# Patient Record
Sex: Female | Born: 1985 | Race: Black or African American | Hispanic: No | Marital: Single | Smoking: Former smoker
Health system: Southern US, Community
[De-identification: ages and names within clinical notes are randomized; demographics above are authoritative.]

## PROBLEM LIST (undated history)

## (undated) DIAGNOSIS — J45909 Unspecified asthma, uncomplicated: Secondary | ICD-10-CM

---
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
## 2006-01-23 ENCOUNTER — Emergency Department (HOSPITAL_COMMUNITY): Admission: EM | Admit: 2006-01-23 | Discharge: 2006-01-23 | Payer: Self-pay | Admitting: Emergency Medicine

## 2006-09-10 ENCOUNTER — Emergency Department (HOSPITAL_COMMUNITY): Admission: EM | Admit: 2006-09-10 | Discharge: 2006-09-10 | Payer: Self-pay | Admitting: Emergency Medicine

## 2006-10-27 ENCOUNTER — Emergency Department (HOSPITAL_COMMUNITY): Admission: EM | Admit: 2006-10-27 | Discharge: 2006-10-27 | Payer: Self-pay | Admitting: Emergency Medicine

## 2007-11-22 ENCOUNTER — Emergency Department (HOSPITAL_COMMUNITY): Admission: EM | Admit: 2007-11-22 | Discharge: 2007-11-22 | Payer: Self-pay | Admitting: Emergency Medicine

## 2009-04-14 ENCOUNTER — Inpatient Hospital Stay (HOSPITAL_COMMUNITY): Admission: AD | Admit: 2009-04-14 | Discharge: 2009-04-14 | Payer: Self-pay | Admitting: Obstetrics & Gynecology

## 2009-06-23 ENCOUNTER — Ambulatory Visit (HOSPITAL_COMMUNITY): Admission: RE | Admit: 2009-06-23 | Discharge: 2009-06-23 | Payer: Self-pay | Admitting: Family Medicine

## 2009-06-28 ENCOUNTER — Observation Stay (HOSPITAL_COMMUNITY): Admission: AD | Admit: 2009-06-28 | Discharge: 2009-06-29 | Payer: Self-pay | Admitting: Obstetrics and Gynecology

## 2009-06-28 ENCOUNTER — Ambulatory Visit: Payer: Self-pay | Admitting: Family Medicine

## 2009-07-14 ENCOUNTER — Ambulatory Visit (HOSPITAL_COMMUNITY): Admission: RE | Admit: 2009-07-14 | Discharge: 2009-07-14 | Payer: Self-pay | Admitting: Family Medicine

## 2009-07-28 ENCOUNTER — Ambulatory Visit (HOSPITAL_COMMUNITY): Admission: RE | Admit: 2009-07-28 | Discharge: 2009-07-28 | Payer: Self-pay | Admitting: Obstetrics & Gynecology

## 2009-10-07 ENCOUNTER — Inpatient Hospital Stay (HOSPITAL_COMMUNITY): Admission: AD | Admit: 2009-10-07 | Discharge: 2009-10-07 | Payer: Self-pay | Admitting: Obstetrics & Gynecology

## 2009-10-07 ENCOUNTER — Ambulatory Visit: Payer: Self-pay | Admitting: Physician Assistant

## 2009-12-09 ENCOUNTER — Inpatient Hospital Stay (HOSPITAL_COMMUNITY): Admission: AD | Admit: 2009-12-09 | Discharge: 2009-12-14 | Payer: Self-pay | Admitting: Obstetrics & Gynecology

## 2009-12-09 ENCOUNTER — Ambulatory Visit: Payer: Self-pay | Admitting: Advanced Practice Midwife

## 2010-07-01 IMAGING — US US OB COMP +14 WK
1 series · 14 of 28 positions shown · non-contrast
Comparison: none

OBSTETRICAL ULTRASOUND:
 This ultrasound exam was performed in the [HOSPITAL] Ultrasound Department.  The OB US report was generated in the AS system, and faxed to the ordering physician.  This report is also available in [HOSPITAL]?s [employer] and in [REDACTED] PACS.

[Series 1: us ob comp +14 wk · 0.22mm/px · 60 acquisitions, 14 frames shown]
[im 3/60]
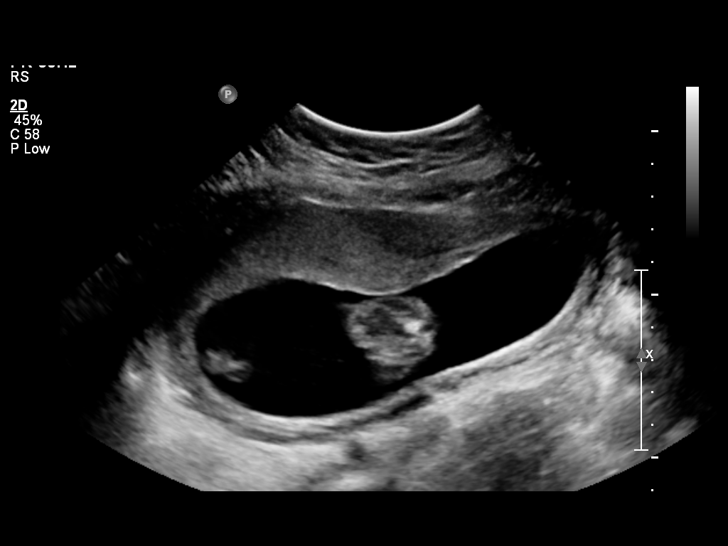
[im 7/60]
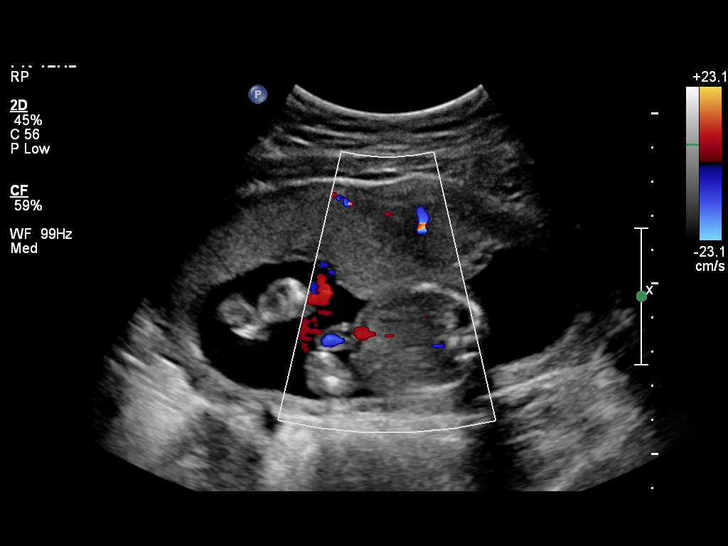
[im 11/60]
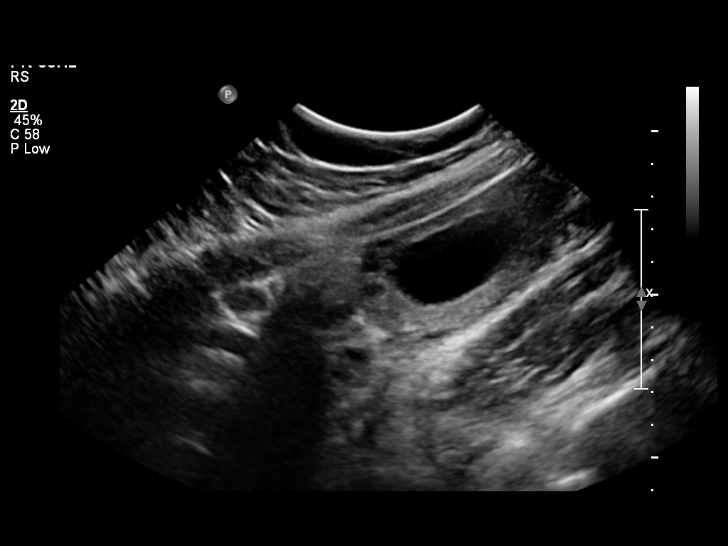
[im 16/60]
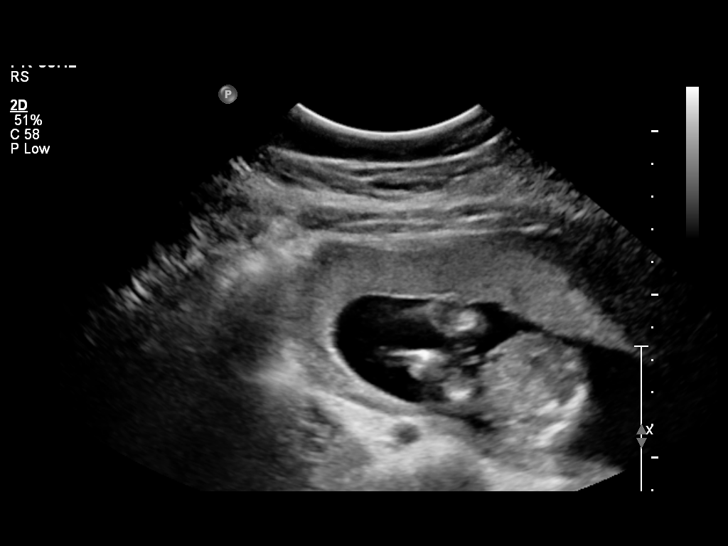
[im 20/60]
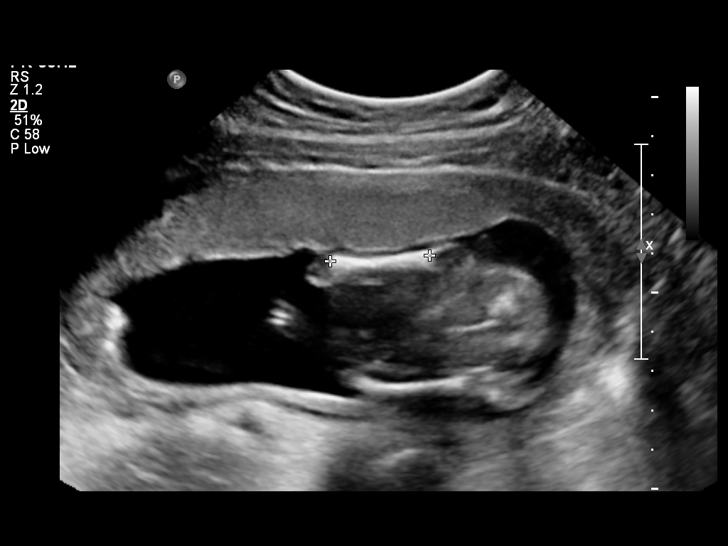
[im 25/60]
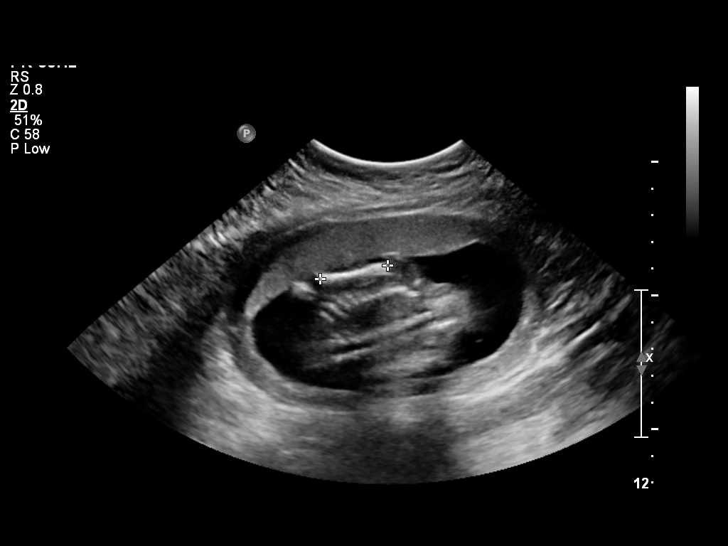
[im 29/60]
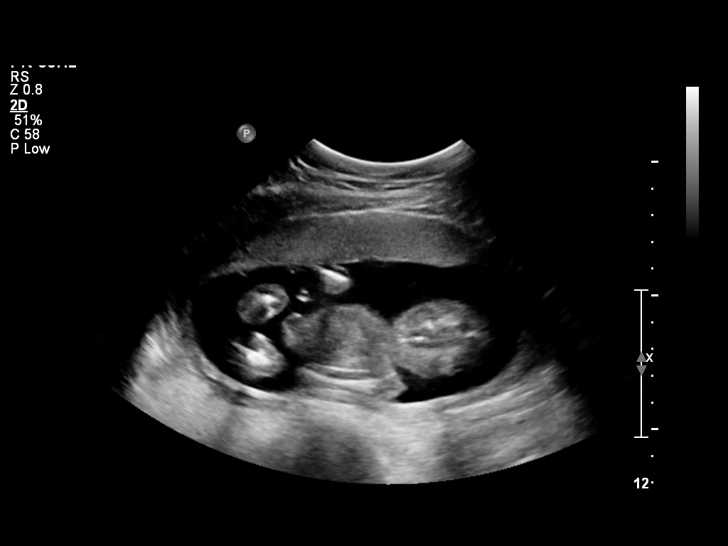
[im 33/60]
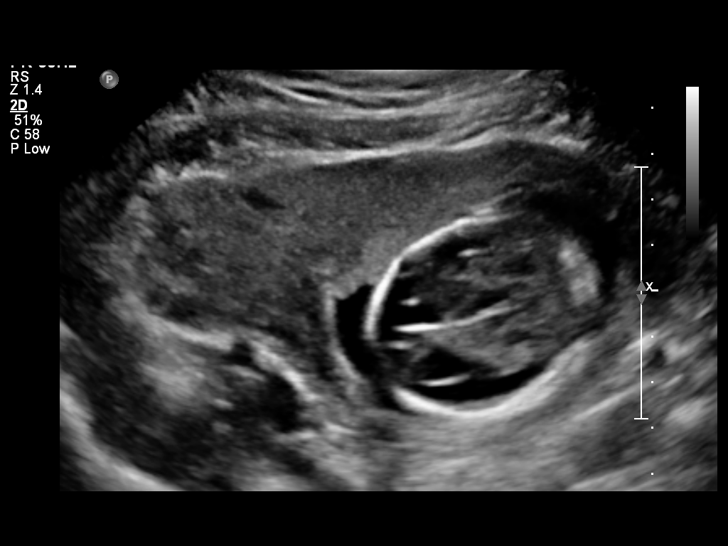
[im 38/60]
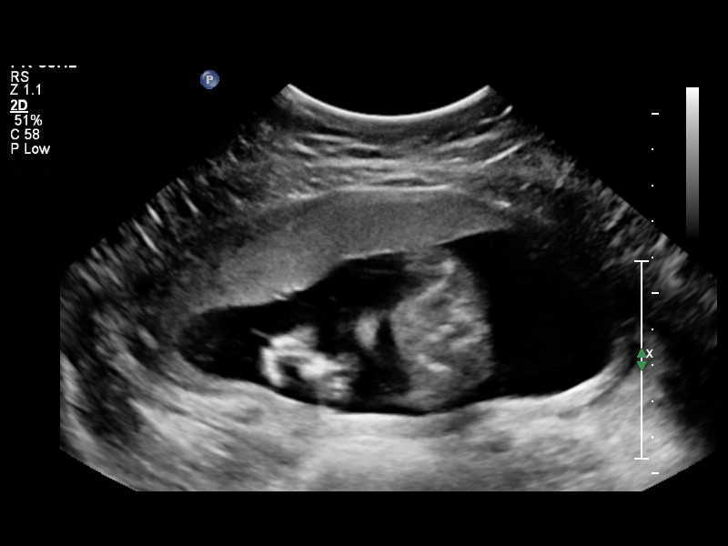
[im 42/60]
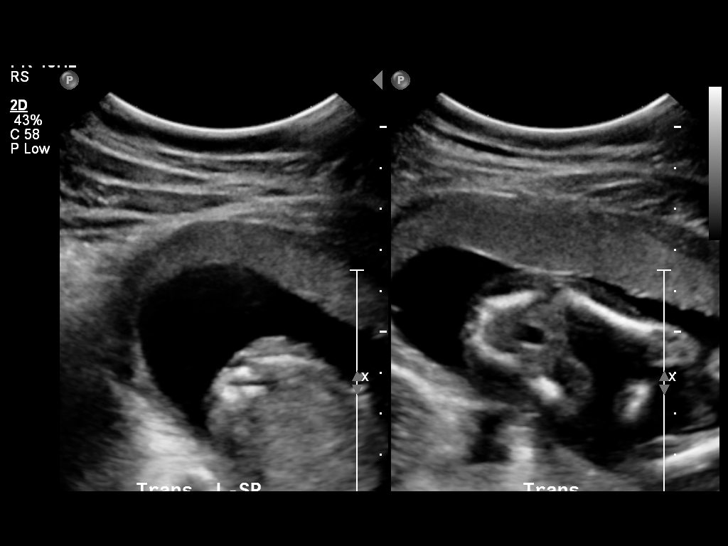
[im 46/60]
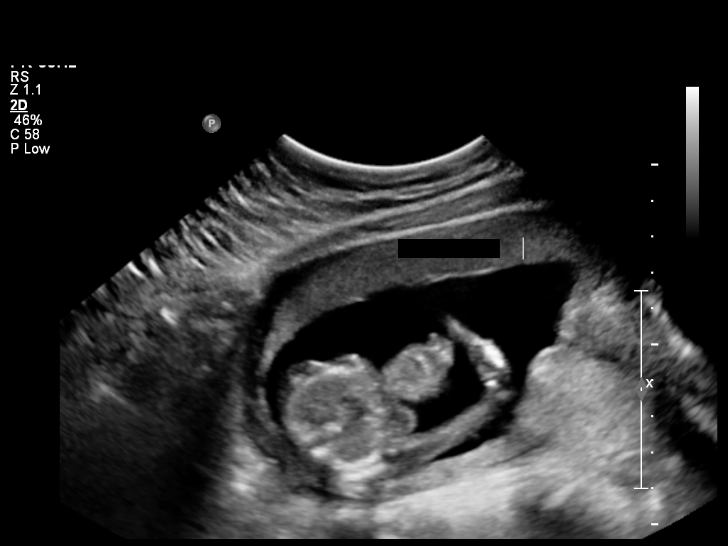
[im 51/60]
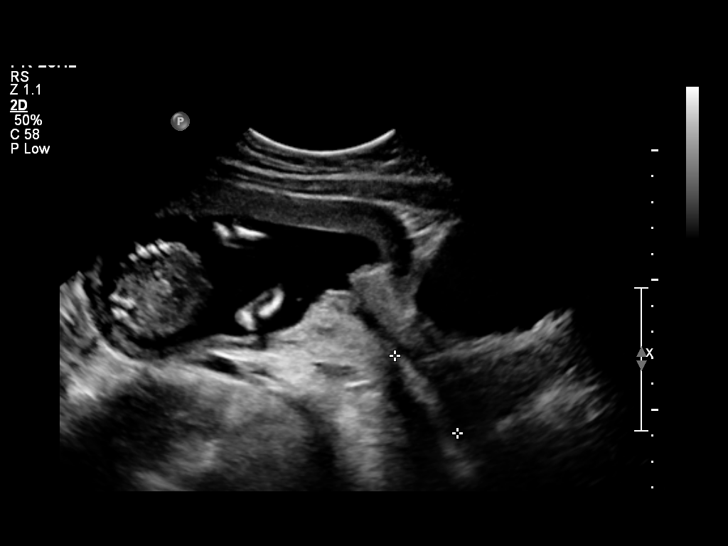
[im 55/60]
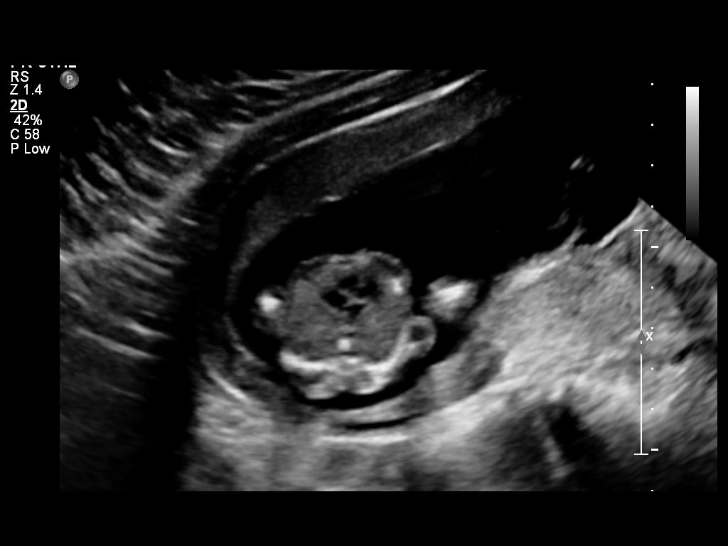
[im 60/60]
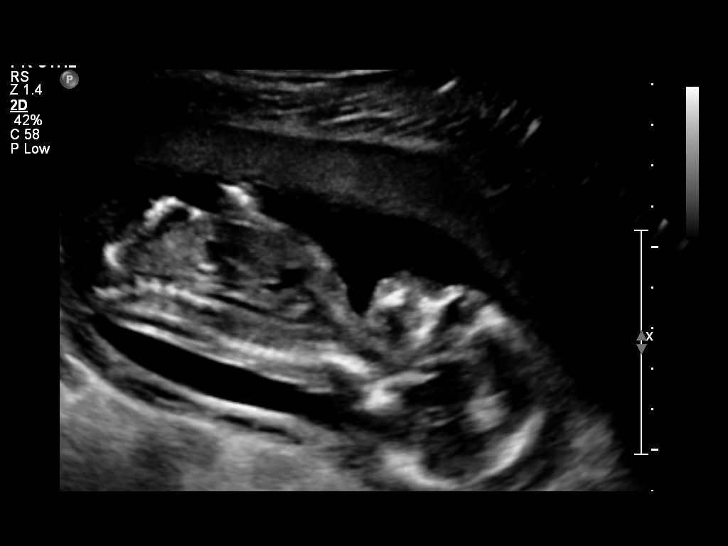

[14 of 28 positions shown; findings below may reference images not displayed]

IMPRESSION: See AS Obstetric US report.

## 2010-09-04 LAB — CBC
HCT: 30.6 % — ABNORMAL LOW (ref 36.0–46.0)
HCT: 34.1 % — ABNORMAL LOW (ref 36.0–46.0)
HCT: 34.8 % — ABNORMAL LOW (ref 36.0–46.0)
Hemoglobin: 11.3 g/dL — ABNORMAL LOW (ref 12.0–15.0)
MCH: 23.1 pg — ABNORMAL LOW (ref 26.0–34.0)
MCHC: 32.3 g/dL (ref 30.0–36.0)
MCHC: 32.4 g/dL (ref 30.0–36.0)
MCHC: 32.6 g/dL (ref 30.0–36.0)
MCHC: 32.7 g/dL (ref 30.0–36.0)
MCV: 69 fL — ABNORMAL LOW (ref 78.0–100.0)
MCV: 71 fL — ABNORMAL LOW (ref 78.0–100.0)
Platelets: 192 10*3/uL (ref 150–400)
Platelets: 198 10*3/uL (ref 150–400)
RBC: 4.4 MIL/uL (ref 3.87–5.11)
RBC: 4.84 MIL/uL (ref 3.87–5.11)
RBC: 4.9 MIL/uL (ref 3.87–5.11)
RDW: 14.6 % (ref 11.5–15.5)
RDW: 15.6 % — ABNORMAL HIGH (ref 11.5–15.5)
RDW: 15.7 % — ABNORMAL HIGH (ref 11.5–15.5)
RDW: 15.8 % — ABNORMAL HIGH (ref 11.5–15.5)
WBC: 14.5 10*3/uL — ABNORMAL HIGH (ref 4.0–10.5)

## 2010-09-04 LAB — URINE CULTURE
Colony Count: NO GROWTH
Culture: NO GROWTH

## 2010-09-04 LAB — COMPREHENSIVE METABOLIC PANEL
AST: 21 U/L (ref 0–37)
Albumin: 3.3 g/dL — ABNORMAL LOW (ref 3.5–5.2)
Alkaline Phosphatase: 57 U/L (ref 39–117)
CO2: 21 mEq/L (ref 19–32)
Calcium: 9.1 mg/dL (ref 8.4–10.5)
Calcium: 9.4 mg/dL (ref 8.4–10.5)
Chloride: 101 mEq/L (ref 96–112)
Chloride: 108 mEq/L (ref 96–112)
GFR calc Af Amer: >60 mL/min (ref >60)
GFR calc Af Amer: >60 mL/min (ref >60)
Glucose, Bld: 94 mg/dL (ref 70–99)
Glucose, Bld: 85 mg/dL (ref 70–99)
Potassium: 4.2 mEq/L (ref 3.5–5.1)
Sodium: 131 mEq/L — ABNORMAL LOW (ref 135–145)
Sodium: 135 mEq/L (ref 135–145)
Total Bilirubin: 0.3 mg/dL (ref 0.3–1.2)
Total Protein: 7.4 g/dL (ref 6.0–8.3)

## 2010-09-04 LAB — URINALYSIS, ROUTINE W REFLEX MICROSCOPIC
Glucose, UA: NEGATIVE mg/dL
Ketones, ur: 15 mg/dL — AB
Protein, ur: NEGATIVE mg/dL
Specific Gravity, Urine: 1.025 (ref 1.005–1.030)
Urobilinogen, UA: 0.2 mg/dL (ref 0.0–1.0)

## 2010-09-04 LAB — URINE MICROSCOPIC-ADD ON

## 2010-09-04 LAB — RPR: RPR Ser Ql: NONREACTIVE

## 2010-09-06 LAB — HERPES SIMPLEX VIRUS CULTURE: Culture: NOT DETECTED

## 2010-09-22 LAB — URINALYSIS, ROUTINE W REFLEX MICROSCOPIC
Ketones, ur: NEGATIVE mg/dL
Nitrite: NEGATIVE
Protein, ur: NEGATIVE mg/dL
Urobilinogen, UA: 0.2 mg/dL (ref 0.0–1.0)

## 2010-09-22 LAB — COMPREHENSIVE METABOLIC PANEL
AST: 25 U/L (ref 0–37)
Alkaline Phosphatase: 77 U/L (ref 39–117)
BUN: 9 mg/dL (ref 6–23)
Calcium: 9.5 mg/dL (ref 8.4–10.5)
Creatinine, Ser: 0.93 mg/dL (ref 0.4–1.2)
Glucose, Bld: 83 mg/dL (ref 70–99)
Potassium: 4 mEq/L (ref 3.5–5.1)

## 2010-09-22 LAB — CBC
MCHC: 32.2 g/dL (ref 30.0–36.0)
Platelets: 237 10*3/uL (ref 150–400)
RBC: 5.64 MIL/uL — ABNORMAL HIGH (ref 3.87–5.11)
RDW: 14.9 % (ref 11.5–15.5)

## 2010-09-22 LAB — GC/CHLAMYDIA PROBE AMP, GENITAL: GC Probe Amp, Genital: NEGATIVE

## 2010-09-22 LAB — LIPASE, BLOOD: Lipase: 48 U/L (ref 11–59)

## 2010-09-22 LAB — URINE CULTURE

## 2010-09-22 LAB — WET PREP, GENITAL

## 2011-03-16 LAB — CULTURE, ROUTINE-ABSCESS

## 2013-01-11 ENCOUNTER — Emergency Department (HOSPITAL_COMMUNITY): Payer: Self-pay

## 2013-01-11 ENCOUNTER — Emergency Department (HOSPITAL_COMMUNITY)
Admission: EM | Admit: 2013-01-11 | Discharge: 2013-01-11 | Disposition: A | Discharge status: Home / Self Care | Payer: Self-pay | Attending: Emergency Medicine | Admitting: Emergency Medicine

## 2013-01-11 ENCOUNTER — Encounter (HOSPITAL_COMMUNITY): Payer: Self-pay | Admitting: *Deleted

## 2013-01-11 DIAGNOSIS — F17200 Nicotine dependence, unspecified, uncomplicated: Secondary | ICD-10-CM | POA: Insufficient documentation

## 2013-01-11 DIAGNOSIS — R0789 Other chest pain: Secondary | ICD-10-CM | POA: Insufficient documentation

## 2013-01-11 DIAGNOSIS — R079 Chest pain, unspecified: Secondary | ICD-10-CM

## 2013-01-11 DIAGNOSIS — Z3202 Encounter for pregnancy test, result negative: Secondary | ICD-10-CM | POA: Insufficient documentation

## 2013-01-11 LAB — CBC
MCH: 22.2 pg — ABNORMAL LOW (ref 26.0–34.0)
Platelets: 192 10*3/uL (ref 150–400)
RBC: 5.68 MIL/uL — ABNORMAL HIGH (ref 3.87–5.11)
RDW: 15 % (ref 11.5–15.5)
WBC: 6.9 10*3/uL (ref 4.0–10.5)

## 2013-01-11 LAB — BASIC METABOLIC PANEL
CO2: 24 mEq/L (ref 19–32)
Calcium: 9.6 mg/dL (ref 8.4–10.5)
Chloride: 102 mEq/L (ref 96–112)
GFR calc Af Amer: >90 mL/min (ref >90)
Sodium: 136 mEq/L (ref 135–145)

## 2013-01-11 LAB — URINALYSIS, ROUTINE W REFLEX MICROSCOPIC
Hgb urine dipstick: NEGATIVE
Ketones, ur: NEGATIVE mg/dL
Protein, ur: NEGATIVE mg/dL
Urobilinogen, UA: 0.2 mg/dL (ref 0.0–1.0)

## 2013-01-11 LAB — PREGNANCY, URINE: Preg Test, Ur: NEGATIVE

## 2013-01-11 LAB — POCT I-STAT TROPONIN I: Troponin i, poc: 0.01 ng/mL (ref 0.00–0.08)

## 2013-01-11 MED ORDER — IBUPROFEN 400 MG PO TABS: 400.0000 mg | ORAL_TABLET | Freq: Four times a day (QID) | ORAL | Status: DC | PRN

## 2013-01-11 MED ORDER — SODIUM CHLORIDE 0.9 % IV BOLUS (SEPSIS)
1000.0000 mL | Freq: Once | INTRAVENOUS | Status: AC
  Administered 2013-01-11: 1000 mL via INTRAVENOUS

## 2013-01-11 NOTE — ED Provider Notes (Signed)
CSN: [id number]     Arrival date & time 01/11/13  1816 History     First MD Initiated Contact with Patient 01/11/13 2028     Chief Complaint  Patient presents with  . Chest Pain   (Consider location/radiation/quality/duration/timing/severity/associated sxs/prior Treatment) HPI Comments: Pt comes in with cc of chest pain. Pt reports that she started having some right sided chest pain about a week ago. The pain is worse with movement, and the pain is intermittent, with no specific provoking factor. The pain is not pleuritic, or exertional. She has no hx of PE, DVT and no risk factors for the same. No cough, fevers.  Patient is a 27 y.o. female presenting with chest pain. The history is provided by the patient.  Chest Pain Associated symptoms: no abdominal pain, no headache, no nausea, no shortness of breath and not vomiting     History reviewed. No pertinent past medical history. History reviewed. No pertinent past surgical history. History reviewed. No pertinent family history. History  Substance Use Topics  . Smoking status: Current Every Day Smoker    Types: Cigarettes  . Smokeless tobacco: Not on file  . Alcohol Use: No   OB History   Grav Para Term Preterm Abortions TAB SAB Ect Mult Living                 Review of Systems  Constitutional: Negative for activity change.  HENT: Negative for neck pain.   Respiratory: Negative for shortness of breath.   Cardiovascular: Positive for chest pain.  Gastrointestinal: Negative for nausea, vomiting and abdominal pain.  Genitourinary: Negative for dysuria.  Neurological: Negative for headaches.    Allergies  Review of patient's allergies indicates no known allergies.  Home Medications   Current Outpatient Rx  Name  Route  Sig  Dispense  Refill  . ibuprofen (ADVIL,MOTRIN) 400 MG tablet   Oral   Take 1 tablet (400 mg total) by mouth every 6 (six) hours as needed for pain.   30 tablet   0    BP 129/103  Pulse 103   Temp(Src) 98.3 F (36.8 C) (Oral)  Resp 23  SpO2 100% Physical Exam  Constitutional: She is oriented to person, place, and time. She appears well-developed and well-nourished.  HENT:  Head: Normocephalic and atraumatic.  Eyes: EOM are normal. Pupils are equal, round, and reactive to light.  Neck: Neck supple.  Cardiovascular: Normal rate, regular rhythm and normal heart sounds.   No murmur heard. Pulmonary/Chest: Effort normal. No respiratory distress. She exhibits no tenderness.  Abdominal: Soft. She exhibits no distension. There is no tenderness. There is no rebound and no guarding.  Neurological: She is alert and oriented to person, place, and time.  Skin: Skin is warm and dry.    ED Course   Procedures (including critical care time)  Labs Reviewed  CBC - Abnormal; Notable for the following:    RBC 5.68 (*)    MCV 64.8 (*)    MCH 22.2 (*)    All other components within normal limits  BASIC METABOLIC PANEL - Abnormal; Notable for the following:    Potassium 3.4 (*)    Glucose, Bld 135 (*)    GFR calc non Af Amer 85 (*)    All other components within normal limits  URINALYSIS, ROUTINE W REFLEX MICROSCOPIC - Abnormal; Notable for the following:    APPearance CLOUDY (*)    All other components within normal limits  D-DIMER, QUANTITATIVE  PREGNANCY, URINE  POCT I-STAT TROPONIN I   Dg Chest 2 View  01/11/2013   *RADIOLOGY REPORT*  Clinical Data: Chest pain  CHEST - 2 VIEW  Comparison: None.  Findings: Lungs are clear. No pleural effusion or pneumothorax.  Cardiomediastinal silhouette is within normal limits.  Visualized osseous structures are within normal limits.  IMPRESSION: Normal chest radiographs.   Original Report Authenticated By: Charline Bills, M.D.   1. Chest pain     MDM  Differential diagnosis includes: ACS syndrome CHF exacerbation Valvular disorder Myocarditis Pericarditis Pericardial effusion Pneumonia Pleural effusion Pulmonary  edema PE Anemia Musculoskeletal pain  Pt comes in with cc of chest pain. Atypical, and based on hx, exam and review of her risks - this is not cardiac in nature. Will get dimer, as she has been tachycardic here, but the pretest probability for PE is very low as well. If labs including dimer is neg, will discharge.    Date: 01/11/2013  Rate: 127  Rhythm: sinus tachycardia rhythm  QRS Axis: normal  Intervals: normal  ST/T Wave abnormalities: normal  Conduction Disutrbances: none  Narrative Interpretation: unremarkable      Derwood Kaplan, MD 01/11/13 2329

## 2013-01-11 NOTE — ED Notes (Signed)
Pt reports right chest/rib pain x over one week, increases with movement, occ sharp pains, "feels like something needs to pop." no distress noted at triage, ekg done.

## 2013-07-06 ENCOUNTER — Emergency Department (HOSPITAL_COMMUNITY)
Admission: EM | Admit: 2013-07-06 | Discharge: 2013-07-06 | Disposition: A | Discharge status: Home / Self Care | Payer: Self-pay | Attending: Emergency Medicine | Admitting: Emergency Medicine

## 2013-07-06 ENCOUNTER — Emergency Department (HOSPITAL_COMMUNITY): Payer: Self-pay

## 2013-07-06 ENCOUNTER — Encounter (HOSPITAL_COMMUNITY): Payer: Self-pay | Admitting: Emergency Medicine

## 2013-07-06 DIAGNOSIS — R071 Chest pain on breathing: Secondary | ICD-10-CM | POA: Insufficient documentation

## 2013-07-06 DIAGNOSIS — Z87891 Personal history of nicotine dependence: Secondary | ICD-10-CM | POA: Insufficient documentation

## 2013-07-06 DIAGNOSIS — J45909 Unspecified asthma, uncomplicated: Secondary | ICD-10-CM | POA: Insufficient documentation

## 2013-07-06 DIAGNOSIS — Z3202 Encounter for pregnancy test, result negative: Secondary | ICD-10-CM | POA: Insufficient documentation

## 2013-07-06 DIAGNOSIS — R0789 Other chest pain: Secondary | ICD-10-CM

## 2013-07-06 HISTORY — DX: Unspecified asthma, uncomplicated: J45.909

## 2013-07-06 LAB — BASIC METABOLIC PANEL
BUN: 8 mg/dL (ref 6–23)
CHLORIDE: 104 meq/L (ref 96–112)
CO2: 24 mEq/L (ref 19–32)
Calcium: 9.5 mg/dL (ref 8.4–10.5)
Creatinine, Ser: 0.91 mg/dL (ref 0.50–1.10)
GFR calc non Af Amer: 86 mL/min — ABNORMAL LOW (ref >90)
Glucose, Bld: 97 mg/dL (ref 70–99)
Potassium: 4.2 mEq/L (ref 3.7–5.3)
Sodium: 142 mEq/L (ref 137–147)

## 2013-07-06 LAB — URINALYSIS, ROUTINE W REFLEX MICROSCOPIC
BILIRUBIN URINE: NEGATIVE
GLUCOSE, UA: NEGATIVE mg/dL
HGB URINE DIPSTICK: NEGATIVE
KETONES UR: NEGATIVE mg/dL
Leukocytes, UA: NEGATIVE
Nitrite: NEGATIVE
PROTEIN: NEGATIVE mg/dL
Specific Gravity, Urine: 1.018 (ref 1.005–1.030)
Urobilinogen, UA: 0.2 mg/dL (ref 0.0–1.0)
pH: 7 (ref 5.0–8.0)

## 2013-07-06 LAB — TROPONIN I: Troponin I: <0.3 ng/mL (ref <0.30)

## 2013-07-06 LAB — CBC WITH DIFFERENTIAL/PLATELET
BASOS ABS: 0 10*3/uL (ref 0.0–0.1)
BASOS PCT: 0 % (ref 0–1)
EOS PCT: 0 % (ref 0–5)
Eosinophils Absolute: 0 10*3/uL (ref 0.0–0.7)
HEMATOCRIT: 37.9 % (ref 36.0–46.0)
Hemoglobin: 12.6 g/dL (ref 12.0–15.0)
Lymphocytes Relative: 30 % (ref 12–46)
Lymphs Abs: 3.1 10*3/uL (ref 0.7–4.0)
MCH: 21.9 pg — ABNORMAL LOW (ref 26.0–34.0)
MCHC: 33.2 g/dL (ref 30.0–36.0)
MCV: 65.8 fL — AB (ref 78.0–100.0)
Monocytes Absolute: 0.8 10*3/uL (ref 0.1–1.0)
Monocytes Relative: 8 % (ref 3–12)
NEUTROS ABS: 6.4 10*3/uL (ref 1.7–7.7)
Neutrophils Relative %: 62 % (ref 43–77)
Platelets: 238 10*3/uL (ref 150–400)
RBC: 5.76 MIL/uL — ABNORMAL HIGH (ref 3.87–5.11)
RDW: 14.9 % (ref 11.5–15.5)
WBC: 10.3 10*3/uL (ref 4.0–10.5)

## 2013-07-06 LAB — D-DIMER, QUANTITATIVE: D-Dimer, Quant: 0.3 ug/mL-FEU (ref 0.00–0.48)

## 2013-07-06 LAB — PREGNANCY, URINE: Preg Test, Ur: NEGATIVE

## 2013-07-06 MED ORDER — METHOCARBAMOL 500 MG PO TABS: 1000.0000 mg | ORAL_TABLET | Freq: Four times a day (QID) | ORAL | Status: DC | PRN

## 2013-07-06 MED ORDER — ACETAMINOPHEN 325 MG PO TABS
650.0000 mg | ORAL_TABLET | Freq: Once | ORAL | Status: AC
  Administered 2013-07-06: 650 mg via ORAL
  Filled 2013-07-06: qty 2

## 2013-07-06 MED ORDER — IBUPROFEN 200 MG PO TABS
400.0000 mg | ORAL_TABLET | Freq: Once | ORAL | Status: AC
  Administered 2013-07-06: 400 mg via ORAL
  Filled 2013-07-06: qty 2

## 2013-07-06 NOTE — ED Provider Notes (Signed)
CSN: [id number]     Arrival date & time 07/06/13  1444 History   First MD Initiated Contact with Patient 07/06/13 1809     Chief Complaint  Patient presents with  . Chest Pain    HPI Pt was seen at 1825. Per pt, c/o gradual onset and persistence of constant chest "pain" for the past 3 months. Pt describes the pain as "sore" with intermittent "sharp" pain that lasts for seconds before resolving. Pain worsens with palpation of the area and body position changes. Pt was evaluated in the ED previously for same and dx with "pulled muscle."  Denies palpitations, no SOB/cough, no abd pain, no N/V/D, no rash, no fevers, no injury.    Past Medical History  Diagnosis Date  . Asthma    History reviewed. No pertinent past surgical history.  History  Substance Use Topics  . Smoking status: Former Smoker    Types: Cigarettes  . Smokeless tobacco: Not on file  . Alcohol Use: No    Review of Systems ROS: Statement: All systems negative except as marked or noted in the HPI; Constitutional: Negative for fever and chills. ; ; Eyes: Negative for eye pain, redness and discharge. ; ; ENMT: Negative for ear pain, hoarseness, nasal congestion, sinus pressure and sore throat. ; ; Cardiovascular: +CP. Negative for palpitations, diaphoresis, dyspnea and peripheral edema. ; ; Respiratory: Negative for cough, wheezing and stridor. ; ; Gastrointestinal: Negative for nausea, vomiting, diarrhea, abdominal pain, blood in stool, hematemesis, jaundice and rectal bleeding. . ; ; Genitourinary: Negative for dysuria, flank pain and hematuria. ; ; Musculoskeletal: Negative for back pain and neck pain. Negative for swelling and trauma.; ; Skin: Negative for pruritus, rash, abrasions, blisters, bruising and skin lesion.; ; Neuro: Negative for headache, lightheadedness and neck stiffness. Negative for weakness, altered level of consciousness , altered mental status, extremity weakness, paresthesias, involuntary movement, seizure  and syncope.       Allergies  Shellfish allergy  Home Medications   Current Outpatient Rx  Name  Route  Sig  Dispense  Refill  . etonogestrel (NEXPLANON) 68 MG IMPL implant   Subcutaneous   Inject 1 each into the skin once.          BP 131/87  Pulse 83  Temp(Src) 98.9 F (37.2 C) (Oral)  Resp 20  SpO2 100% Physical Exam 1830: Physical examination:  Nursing notes reviewed; Vital signs and O2 SAT reviewed;  Constitutional: Well developed, Well nourished, Well hydrated, In no acute distress; Head:  Normocephalic, atraumatic; Eyes: EOMI, PERRL, No scleral icterus; ENMT: TM's clear bilat. +edemetous nasal turbinates bilat with clear rhinorrhea. Mouth and pharynx normal, Mucous membranes moist; Neck: Supple, Full range of motion, No lymphadenopathy; Cardiovascular: Regular rate and rhythm, No gallop; Respiratory: Breath sounds clear & equal bilaterally, No rales, rhonchi, wheezes.  Speaking full sentences with ease, Normal respiratory effort/excursion; Chest: +bilat parasternal areas tender to palp. No rash, no deformity, no soft tissue crepitus. Movement normal; Abdomen: Soft, Nontender, Nondistended, Normal bowel sounds; Genitourinary: No CVA tenderness; Extremities: Pulses normal, No tenderness, No edema, No calf edema or asymmetry.; Neuro: AA&Ox3, Major CN grossly intact.  Speech clear. No gross focal motor or sensory deficits in extremities.; Skin: Color normal, Warm, Dry.   ED Course  Procedures   EKG Interpretation    Date/Time:  Sunday July 06 2013 14:50:55 EST Ventricular Rate:  94 PR Interval:  142 QRS Duration: 72 QT Interval:  336 QTC Calculation: 420 R Axis:   66 Text  Interpretation:  Normal sinus rhythm with sinus arrhythmia When compared with ECG of 01/11/2013 Nonspecific ST and T wave abnormality is no longer Present Confirmed by [facility]  MD, Nicholos Johns [phone]) on 07/06/2013 6:06:12 PM            MDM  MDM Reviewed: previous chart, nursing note and  vitals Reviewed previous: labs, ECG and x-ray Interpretation: labs, ECG and x-ray     Results for orders placed during the hospital encounter of 07/06/13  BASIC METABOLIC PANEL      Result Value Range   Sodium 142  137 - 147 mEq/L   Potassium 4.2  3.7 - 5.3 mEq/L   Chloride 104  96 - 112 mEq/L   CO2 24  19 - 32 mEq/L   Glucose, Bld 97  70 - 99 mg/dL   BUN 8  6 - 23 mg/dL   Creatinine, Ser 9.60  0.50 - 1.10 mg/dL   Calcium 9.5  8.4 - 45.4 mg/dL   GFR calc non Af Amer 86 (*) >90 mL/min   GFR calc Af Amer >90  >90 mL/min  CBC WITH DIFFERENTIAL      Result Value Range   WBC 10.3  4.0 - 10.5 K/uL   RBC 5.76 (*) 3.87 - 5.11 MIL/uL   Hemoglobin 12.6  12.0 - 15.0 g/dL   HCT 09.8  11.9 - 14.7 %   MCV 65.8 (*) 78.0 - 100.0 fL   MCH 21.9 (*) 26.0 - 34.0 pg   MCHC 33.2  30.0 - 36.0 g/dL   RDW 82.9  56.2 - 13.0 %   Platelets 238  150 - 400 K/uL   Neutrophils Relative % 62  43 - 77 %   Neutro Abs 6.4  1.7 - 7.7 K/uL   Lymphocytes Relative 30  12 - 46 %   Lymphs Abs 3.1  0.7 - 4.0 K/uL   Monocytes Relative 8  3 - 12 %   Monocytes Absolute 0.8  0.1 - 1.0 K/uL   Eosinophils Relative 0  0 - 5 %   Eosinophils Absolute 0.0  0.0 - 0.7 K/uL   Basophils Relative 0  0 - 1 %   Basophils Absolute 0.0  0.0 - 0.1 K/uL  TROPONIN I      Result Value Range   Troponin I <0.30  <0.30 ng/mL  D-DIMER, QUANTITATIVE      Result Value Range   D-Dimer, Quant 0.30  0.00 - 0.48 ug/mL-FEU  URINALYSIS, ROUTINE W REFLEX MICROSCOPIC      Result Value Range   Color, Urine YELLOW  YELLOW   APPearance CLEAR  CLEAR   Specific Gravity, Urine 1.018  1.005 - 1.030   pH 7.0  5.0 - 8.0   Glucose, UA NEGATIVE  NEGATIVE mg/dL   Hgb urine dipstick NEGATIVE  NEGATIVE   Bilirubin Urine NEGATIVE  NEGATIVE   Ketones, ur NEGATIVE  NEGATIVE mg/dL   Protein, ur NEGATIVE  NEGATIVE mg/dL   Urobilinogen, UA 0.2  0.0 - 1.0 mg/dL   Nitrite NEGATIVE  NEGATIVE   Leukocytes, UA NEGATIVE  NEGATIVE  PREGNANCY, URINE      Result  Value Range   Preg Test, Ur NEGATIVE  NEGATIVE   Dg Chest 2 View 07/06/2013   CLINICAL DATA:  Chest pain - 4 months  EXAM: CHEST  2 VIEW  COMPARISON:  DG CHEST 2 VIEW dated 01/11/2013  FINDINGS: Normal mediastinum and cardiac silhouette. Normal pulmonary vasculature. No evidence of effusion, infiltrate, or pneumothorax. No  acute bony abnormality.  IMPRESSION: No acute cardiopulmonary process.   Electronically Signed   By: Genevive Bi M.D.   On: 07/06/2013 20:26    2130:  Doubt PE as cause for symptoms with normal d-dimer and low risk [place].  Doubt ACS as cause for symptoms with normal troponin and unchanged EKG from previous after 3 months of constant symptoms. Feels better after meds and wants to go home now. Will continue to tx symptomatically. Dx and testing d/w pt.  Questions answered.  Verb understanding, agreeable to d/c home with outpt f/u.    Laray Anger, DO 07/07/13 1300

## 2013-07-06 NOTE — Discharge Instructions (Signed)
°Emergency Department Resource Guide °1) Find a Doctor and Pay Out of Pocket °Although you won't have to find out who is covered by your insurance plan, it is a good idea to ask around and get recommendations. You will then need to call the office and see if the doctor you have chosen will accept you as a new patient and what types of options they offer for patients who are self-pay. Some doctors offer discounts or will set up payment plans for their patients who do not have insurance, but you will need to ask so you aren't surprised when you get to your appointment. ° °2) Contact Your Local Health Department °Not all health departments have doctors that can see patients for sick visits, but many do, so it is worth a call to see if yours does. If you don't know where your local health department is, you can check in your phone book. The CDC also has a tool to help you locate your state's health department, and many state websites also have listings of all of their local health departments. ° °3) Find a Walk-in Clinic °If your illness is not likely to be very severe or complicated, you may want to try a walk in clinic. These are popping up all over the country in pharmacies, drugstores, and shopping centers. They're usually staffed by nurse practitioners or physician assistants that have been trained to treat common illnesses and complaints. They're usually fairly quick and inexpensive. However, if you have serious medical issues or chronic medical problems, these are probably not your best option. ° °No Primary Care Doctor: °- Call [facility] at  832-8000 - they can help you locate a primary care doctor that  accepts your insurance, provides certain services, etc. °- Physician Referral Service- 1-800-533-3463 ° °Chronic Pain Problems: °Organization         Address  Phone   Notes  °[facility]  (336) 297-2271 Patients need to be referred by their primary care doctor.  ° °Medication  Assistance: °Organization         Address  Phone   Notes  °[place] [facility] [address] °[place], [place] [address] (336) 641-8030 --Must be a resident of [place] °-- Must have NO insurance coverage whatsoever (no Medicaid/ Medicare, etc.) °-- The pt. MUST have a primary care doctor that directs their care regularly and follows them in the community °  °[facility]  (866) 331-1348   °[employer]  (888) 892-1162   ° °Agencies that provide inexpensive medical care: °Organization         Address  Phone   Notes  °[facility]  (336) 832-8035   °[facility]    (336) 832-7272   °[facility] [address] °[place], [place] [address] (336) 832-4777   °[facility] [address], °[place] (336) 271-4999   °[facility]    (336) 373-0678   °[facility]    (336) 272-1050   °[facility] ° [address], [place] Phone:  (336) 832-4444, Fax:  (336) 832-4440 Hours of Operation:  9 am - 6 pm, M-F.  Also accepts Medicaid/Medicare and self-pay.  °[facility] ° [address], [place] Phone: (336) 832-3150, Fax: (336) 832-3151. Hours of Operation:  8:30 am - 5:30 pm, M-F.  Also accepts Medicaid and self-pay.  °[facility] [place] [address], [place] Phone: (336) 878-6027   °[facility] [address], [place], [place] (336)723-1848, Ext. 123 Mondays & Thursdays: 7-9 AM.  First 15 patients are seen on a first come, first serve basis. °  ° °Medicaid-accepting [place] Providers: ° °Organization         Address  Phone   Notes  °[facility] [address], [place] (336) 641-2100 Also accepts self-pay patients.  °[facility] [address], [place] ° (336) 856-9996   °[facility] [address], [place]  (336) 288-8857   °[facility] [address] [place] [address], [place] (336) 299-7000   °Veita Bland [address], [place]  ° (336) 373-1557 Only accepts [place] Access Medicaid patients after they have their name applied to their card.  ° °Self-Pay (no insurance) in [place]: ° °Organization         Address  Phone   Notes  °Sickle Cell Patients, [facility] [address], [place] (336) 832-1970   °[facility] [address], [place] (336) 832-4400   °[facility] ° [address], [place] (336) 992-4800   °[facility]/Dr. Osei-Bonsu ° [address] [place] [address], [place] or [address], [place] (336) 841-8500 Phone number for both [place] and [place] locations is the same.  °[facility] [address], [place] (336) 299-0000   °[facility] [address] [place] [address], [place] or [address] (336) 852-7530 °(336) 878-2260   °[facility] [address], [place] (336) 350-1642, phone; (336) 294-5005, fax Sees patients 1st and 3rd Saturday of every month.  Must not qualify for public or private insurance (i.e. Medicaid, Medicare, [place] Health Choice, Veterans' Benefits) • Household income should be no more than 200% of the poverty level •The clinic cannot treat you if you are pregnant or think you are pregnant • Sexually transmitted diseases are not treated at the clinic.  ° ° °Dental Care: °Organization         Address  Phone  Notes  °[place] [facility] [address], [place] (336) 641-6152 Accepts children up to age 21 who are enrolled in Medicaid or [place] Health Choice; pregnant women with a Medicaid card; and children who have applied for Medicaid or [place] Health Choice, but were declined, whose parents can pay a reduced fee at time of service.  °[place]  [facility] [place]  [address], [place] (336) 641-7733 Accepts children up to age 21 who are enrolled in Medicaid or [place] Health Choice; pregnant women with a Medicaid card; and children who have applied for Medicaid or [place] Health Choice, but were declined, whose parents can pay a reduced fee at time of service.  °[facility] ° [address], [place] (336) 641-4533 Patients are seen by appointment only. Walk-ins are not accepted. [facility] will see patients 18 years of age and older. °Monday - Tuesday (8am-5pm) °Most Wednesdays (8:30-5pm) °$30 per visit, cash only  °[facility] ° [address], [place] (336) 641-4533 Patients are seen by appointment only. Walk-ins are not accepted. [facility] will see patients 18 years of age and older. °One   Wednesday Evening (Monthly: Volunteer Based).  $30 per visit, cash only  °[facility]  (919) 537-3737 for adults; Children under age 4, call Graduate Pediatric Dentistry at (919) [phone]. Children aged 4-14, please call (919) [phone] to request a pediatric application. ° Dental services are provided in all areas of dental care including fillings, crowns and bridges, complete and partial dentures, implants, gum treatment, root canals, and extractions. Preventive care is also provided. Treatment is provided to both adults and children. °Patients are selected via a lottery and there is often a waiting list. °  °[facility] [address], °[place] ° (336) 763-8833 [email or web address] °  °[facility] [address], [place], [place] (336)723-1848, Ext. 123 Second and Fourth Thursday of each month, opens at 6:30 AM; Clinic ends at 9 AM.  Patients are seen on a first-come first-served basis, and a limited number are seen during each clinic.  ° °[facility] ° [address], [place], [place] (336) 723-7904    Eligibility Requirements °You must have lived in [place], [place], or [place] counties for at least the last three months. °  You cannot be eligible for state or federal sponsored healthcare insurance, including Veterans Administration, Medicaid, or Medicare. °  You generally cannot be eligible for healthcare insurance through your employer.  °  How to apply: °Eligibility screenings are held every Tuesday and Wednesday afternoon from 1:00 pm until 4:00 pm. You do not need an appointment for the interview!  °[facility] [address], [place], [place] 336-631-2330   °[facility]  336-342-8273   °[facility]  336-703-3100   °[facility]  336-570-6415   ° °Behavioral Health Resources in the Community: °Intensive Outpatient Programs °Organization         Address  Phone  Notes  °[facility] [address], [place], [place] 336-878-6098   °[facility] [address], [place], [place] 336-832-9800   °[facility] [address], [place], [place] ° 336-882-2125   °[facility] [address],  °[place], [place] 1-800-853-5163 or 336-641-4981   °Substance Abuse Resources °Organization         Address  Phone  Notes  °[facility]  336-882-2125   °[facility]  336-784-9470   °[facility]  336-285-9073   °[facility]  336-845-3988   °Residential & Outpatient Substance Abuse Program  1-800-659-3381   °Psychological Services °Organization         Address  Phone  Notes  °[facility]  336- 832-9600   °[facility]  336- 378-7881   °[facility] [address], [place] 1-800-853-5163 or 336-641-4981   ° °Mobile Crisis Teams °Organization         Address  Phone  Notes  °[facility], Mobile Crisis Care Unit  1-877-626-1772   °Assertive °[facility] ° [address]  [place], [place] 336-834-9664   °Sharon DeEsch [address] °[place] 336-554-5454   ° °Self-Help/Support Groups °Organization         Address  Phone             Notes  °[facility] of  - variety of support groups  336- 373-1402 Call for more information  °Narcotics Anonymous (NA), [facility] [address], °[place]  2 meetings at this location  ° °  Residential Treatment Programs Organization         Address  Phone  Notes  [facility] [address],    [place]  [phone]   [facility]  [address], [place] [phone], [place], [place] [phone]   [facility] [address], [place] [phone] Admissions: 8am-3pm M-F  [facility] [address]    [place], [place] [phone]   [facility] [address], [place], [place] [phone]   [facility] [address]  [place], [place] [phone]   [facility] Intensive Outpatient [address], [place], [place] [phone]   [facility] ([facility] [address]  [place], [place] [phone] or 334-304-1802(319) [phone]   [facility] ([facility]) [address] [place], [place] [phone] Accepts Medicaid  [facility] [address]  [place] [phone] Substance Abuse/Addiction Treatment   [facility]         Address  Phone  Notes  [facility]  410-370-8680(888) (361)308-6546   Angie FavaJulie Brannon, PhD [address], [address] [place], [place]   [phone](336) [phone] or [phone](336) (450) [phone]   [facility] [place]   [address] [place], [place] [phone](336) [phone]   [facility] [address], [place], [place] [phone](336) [phone] Insurance/Medicaid/sponsorship through [facility] [address]                                    [place], [place] [phone](336) [phone] Therapy/tele-psych/case    [facility] [address]   [place], [place] [phone](336) [phone]    Dr. Lolly MustacheArfeen  [phone]) [phone]   [facility] [place]  [employer] [facility] 1) [address], [place] 2) [address], [place] 3)  [address], [place] [phone](336) [phone](336) [phone]  [phone](336) [phone]   [facility] Child Abuse Hot[facility] [phone](336) [phone] or [phone](336) (743)[phone] (After Hours)       Take the prescription as directed. Take over the counter tylenol and ibuprofen, as directed on packaging, as needed for discomfort. Apply moist heat or ice to the area(s) of discomfort, for 15 minutes at a time, several times per day for the next few days.  Do not fall asleep on a heating or ice pack.  Call your regular medical doctor tomorrow morning to schedule a follow up appointment this week.  Return to the Emergency Department immediately if worsening.

## 2013-07-06 NOTE — ED Notes (Signed)
Returned to room.

## 2013-07-06 NOTE — ED Notes (Signed)
Pt reports that she was seen back in October for the same and told she probably pulled a muscle. Reports the pain started back a couple of days ago on the right side of her chest. Also reports a headache over the past couple of days.

## 2013-07-06 NOTE — ED Notes (Signed)
Patient transported to X-ray 

## 2013-10-01 ENCOUNTER — Emergency Department (HOSPITAL_COMMUNITY)
Admission: EM | Admit: 2013-10-01 | Discharge: 2013-10-01 | Disposition: A | Discharge status: Home / Self Care | Payer: Self-pay | Attending: Emergency Medicine | Admitting: Emergency Medicine

## 2013-10-01 ENCOUNTER — Encounter (HOSPITAL_COMMUNITY): Payer: Self-pay | Admitting: Emergency Medicine

## 2013-10-01 DIAGNOSIS — M25569 Pain in unspecified knee: Secondary | ICD-10-CM | POA: Insufficient documentation

## 2013-10-01 DIAGNOSIS — S86899A Other injury of other muscle(s) and tendon(s) at lower leg level, unspecified leg, initial encounter: Secondary | ICD-10-CM

## 2013-10-01 DIAGNOSIS — J45909 Unspecified asthma, uncomplicated: Secondary | ICD-10-CM | POA: Insufficient documentation

## 2013-10-01 DIAGNOSIS — Z87891 Personal history of nicotine dependence: Secondary | ICD-10-CM | POA: Insufficient documentation

## 2013-10-01 DIAGNOSIS — M79603 Pain in arm, unspecified: Secondary | ICD-10-CM

## 2013-10-01 DIAGNOSIS — Z9889 Other specified postprocedural states: Secondary | ICD-10-CM | POA: Insufficient documentation

## 2013-10-01 DIAGNOSIS — M79609 Pain in unspecified limb: Secondary | ICD-10-CM | POA: Insufficient documentation

## 2013-10-01 MED ORDER — IBUPROFEN 800 MG PO TABS: 800.0000 mg | ORAL_TABLET | Freq: Three times a day (TID) | ORAL | Status: DC

## 2013-10-01 NOTE — ED Notes (Addendum)
Pt c/o L arm and L leg pain.  Pt denies any injury/trauma.  Pt states she has birth control implant in her L arm, but not sure if it's related.  Pt ambulatory without assistance.  NAD noted.

## 2013-10-01 NOTE — ED Provider Notes (Signed)
CSN: [id number]     Arrival date & time 10/01/13  1344 History   First MD Initiated Contact with Patient 10/01/13 1606     Chief Complaint  Patient presents with  . Leg Pain  . Arm Pain     (Consider location/radiation/quality/duration/timing/severity/associated sxs/prior Treatment) HPI Comments: Eileen Clark is a 28 y.o. female with a past medical history of asthma and Nexplanon implant presenting the Emergency Department with a chief complaint of Left lower extremity pain since yesterday and left arm pain since today.  The patient reports discomfort to the back of upper arm, denies injury.  Reports self resolving.  Nexplanon implant to left arm placed 3 years ago.  She also reports left anterior tibial pain since last night.  Denies injury reports increase discomfort with ambulation. NO PCP   Patient is a 28 y.o. female presenting with leg pain and arm pain. The history is provided by the patient and a relative. No language interpreter was used.  Leg Pain Associated symptoms: no fever   Arm Pain Associated symptoms include arthralgias and myalgias. Pertinent negatives include no chest pain, chills, fever, joint swelling or rash.    Past Medical History  Diagnosis Date  . Asthma    History reviewed. No pertinent past surgical history. No family history on file. History  Substance Use Topics  . Smoking status: Former Smoker    Types: Cigarettes  . Smokeless tobacco: Not on file  . Alcohol Use: No   OB History   Grav Para Term Preterm Abortions TAB SAB Ect Mult Living                 Review of Systems  Constitutional: Negative for fever and chills.  Respiratory: Negative for shortness of breath.   Cardiovascular: Negative for chest pain, palpitations and leg swelling.  Musculoskeletal: Positive for arthralgias and myalgias. Negative for gait problem and joint swelling.  Skin: Negative for color change, rash and wound.      Allergies  Shellfish allergy  Home  Medications   Prior to Admission medications   Medication Sig Start Date End Date Taking? Authorizing Provider  etonogestrel (NEXPLANON) 68 MG IMPL implant Inject 1 each into the skin once.   Yes Historical Provider, MD   BP 126/73  Pulse 90  Temp(Src) 98.3 F (36.8 C) (Oral)  Resp 18  SpO2 99% Physical Exam  Nursing note and vitals reviewed. Constitutional: She is oriented to person, place, and time. She appears well-developed and well-nourished. No distress.  HENT:  Head: Normocephalic and atraumatic.  Neck: Neck supple.  Pulmonary/Chest: Effort normal. No respiratory distress.  Musculoskeletal:  Non-tender to palpation, reports some discomfort with dorsal flexion and ambulation.  No edema, no skin changes, no drainage or signs of infection.  NV intact, good strength bilaterally.  Left upper extremity: Implant palpable, no overlying erythema, or skin changes, no signs of infection, non-tender to palpation.  Neurological: She is alert and oriented to person, place, and time.  Skin: Skin is warm and dry. She is not diaphoretic.  Psychiatric: She has a normal mood and affect. Her behavior is normal.    ED Course  Procedures (including critical care time) Labs Review Labs Reviewed - No data to display  Imaging Review No results found.   EKG Interpretation None      MDM   Final diagnoses:  Shin splint  Arm pain, posterior   Pt with left arm pain and left leg pain, no signs of infection or swell  to suggest DVT.  No history of trauma, able to ambulate and move all extremities equally. Likely shin splints, RICE encouraged. will have the patient follow up with ortho.  Discussed treatment plan with the patient and patient's grandmother. Return precautions given. Reports understanding and no other concerns at this time.  Patient is stable for discharge at this time.   Meds given in ED:  Medications - No data to display  New Prescriptions   IBUPROFEN (ADVIL,MOTRIN) 800 MG  TABLET    Take 1 tablet (800 mg total) by mouth 3 (three) times daily. Take with food        Clabe SealLauren M Michiko Lineman, PA-C 10/01/13 1642

## 2013-10-01 NOTE — ED Provider Notes (Signed)
Medical screening examination/treatment/procedure(s) were performed by non-physician practitioner and as supervising physician I was immediately available for consultation/collaboration.   EKG Interpretation None        Gwyneth SproutWhitney Raylan Troiani, MD 10/01/13 [phone]

## 2013-10-01 NOTE — Discharge Instructions (Signed)
Call for a follow up appointment with a Family or Primary Care Provider or Dr. Konrad DoloresMerrell. Call for an appointment with Dr. Eulah PontMurphy, for further evaluation of your leg pain. Return if Symptoms worsen.   Take medication as prescribed.  Ice your arm and leg 3-4 times a day and elevate when not walking or standing, or using your arm.

## 2013-10-01 NOTE — ED Notes (Signed)
Name called for vitals, no answer

## 2014-04-13 ENCOUNTER — Emergency Department (HOSPITAL_COMMUNITY)
Admission: EM | Admit: 2014-04-13 | Discharge: 2014-04-13 | Disposition: A | Discharge status: Home / Self Care | Payer: Self-pay | Attending: Emergency Medicine | Admitting: Emergency Medicine

## 2014-04-13 ENCOUNTER — Encounter (HOSPITAL_COMMUNITY): Payer: Self-pay | Admitting: Emergency Medicine

## 2014-04-13 DIAGNOSIS — Z87891 Personal history of nicotine dependence: Secondary | ICD-10-CM | POA: Insufficient documentation

## 2014-04-13 DIAGNOSIS — J45909 Unspecified asthma, uncomplicated: Secondary | ICD-10-CM | POA: Insufficient documentation

## 2014-04-13 DIAGNOSIS — J029 Acute pharyngitis, unspecified: Secondary | ICD-10-CM | POA: Insufficient documentation

## 2014-04-13 LAB — RAPID STREP SCREEN (MED CTR MEBANE ONLY): Streptococcus, Group A Screen (Direct): NEGATIVE

## 2014-04-13 MED ORDER — IBUPROFEN 100 MG/5ML PO SUSP: 200.0000 mg | Freq: Four times a day (QID) | ORAL | Status: DC | PRN

## 2014-04-13 MED ORDER — IBUPROFEN 100 MG/5ML PO SUSP
400.0000 mg | Freq: Once | ORAL | Status: AC
  Administered 2014-04-13: 400 mg via ORAL
  Filled 2014-04-13: qty 20

## 2014-04-13 NOTE — ED Notes (Signed)
Pt. reports sore throat /swelling onset Thursday last week with occasional dry cough , denies fever or chills , airway intact / respirations unlabored .

## 2014-04-13 NOTE — Discharge Instructions (Signed)
Return to the emergency room with worsening of symptoms, new symptoms or with symptoms that are concerning. , especially fevers, stiff neck, worsening headache, nausea/vomiting, visual changes or slurred speech, chest pain, shortness of breath, cough with thick colored mucous or blood Drink plenty of fluids with electrolytes especially Gatorade. OTC cold medications such as mucinex, nyquil, dayquil are recommended. Chloraseptic for sore throat. Ibuprofen 400mg  (2 tablets 200mg ) every 5-6 hours for 3 days and then as needed for pain. Follow up with PCP if symptoms worsen or are persistent.   Emergency Department Resource Guide 1) Find a Doctor and Pay Out of Pocket Although you won't have to find out who is covered by your insurance plan, it is a good idea to ask around and get recommendations. You will then need to call the office and see if the doctor you have chosen will accept you as a new patient and what types of options they offer for patients who are self-pay. Some doctors offer discounts or will set up payment plans for their patients who do not have insurance, but you will need to ask so you aren't surprised when you get to your appointment.  2) Contact Your Local Health Department Not all health departments have doctors that can see patients for sick visits, but many do, so it is worth a call to see if yours does. If you don't know where your local health department is, you can check in your phone book. The CDC also has a tool to help you locate your state's health department, and many state websites also have listings of all of their local health departments.  3) Find a Walk-in Clinic If your illness is not likely to be very severe or complicated, you may want to try a walk in clinic. These are popping up all over the country in pharmacies, drugstores, and shopping centers. They're usually staffed by nurse practitioners or physician assistants that have been trained to treat common illnesses  and complaints. They're usually fairly quick and inexpensive. However, if you have serious medical issues or chronic medical problems, these are probably not your best option.  No Primary Care Doctor: - Call Health Connect at  [phone] - they can help you locate a primary care doctor that  accepts your insurance, provides certain services, etc. - Physician Referral Service- [phone]  Chronic Pain Problems: Organization         Address  Phone   Notes  [facility]  [phone] Patients need to be referred by their primary care doctor.   Medication Assistance: Organization         Address  Phone   Notes  [facility] Medication [facility] [address] [place] [place], [place] [address] [phone] --Must be a resident of [facility] -- Must have NO insurance coverage whatsoever (no Medicaid/ Medicare, etc.) -- The pt. MUST have a primary care doctor that directs their care regularly and follows them in the community   [facility]ist  [phone]   [employer]  [phone]    Agencies that provide inexpensive medical care: Organization         Address  Phone   Notes  [facility]  [phone]   [facility]    [phone]   [facility] [address] [place], [place] [address] [phone]   [facility] [address], [place] [phone]   Planned Parenthood    (  [phone]   [facility]    [phone]   [facility]  [address], [place] Phone:  [phone], Fax:  [phone] Hours of Operation:  9 am - 6 pm, M-F.  Also accepts Medicaid/Medicare and self-pay.  [facility]  [address], [place] Phone: [phone], Fax: [phone]. Hours of Operation:  8:30 am - 5:30 pm, M-F.  Also accepts Medicaid and self-pay.  [facility] [place] [address], [place] Phone: [phone]   [facility] [address] Natasha Bence [place], [place] [phone], Ext. [phone] Mondays & Thursdays: 7-9 AM.  First 15 patients are seen on a first come, first serve basis.    Medicaid-accepting [facility] Providers:  Organization         Address  Phone   Notes  [facility] [address],  [phone] Also accepts self-pay patients.  [facility] [address] Laurell Josephs [place], [place]  [phone]   [facility] [address], [place] [phone]   [facility] [address], [place] [phone]   Renaye Rakers [address], [place]   [phone] Only accepts [place] patients after they have their name applied to their card.   Self-Pay (no insurance) in [facility]:  Organization         Address  Phone   Notes  Sickle Cell Patients, [facility] [address] [place], [place] [phone]   [facility] [address] [place], [place] [phone]   [facility]  [address], [place] [phone]   [facility]/Dr. Osei-Bonsu  [address], [place] or [address], [place] [phone] Phone number for both [place] and [place] locations is the same.  [facility] [address], [place] [phone]   [facility] [address] [place] or [address] [phone]   [facility] [address], [place] [phone], phone; [phone], fax Sees patients 1st and 3rd Saturday of every month.  Must not qualify for public or private insurance (i.e. Medicaid, Medicare, [place] Health Choice, Veterans' Benefits)  Household income should be no more than 200% of the poverty level  The clinic cannot treat you if you are pregnant or think you are pregnant  Sexually transmitted diseases are not treated at the clinic.    Dental Care: Organization         Address  Phone  Notes  [facility] of [facility] [address] [place], [place] [phone] Accepts children up to age 23 who are enrolled in [place] or [place] Health Choice; pregnant women with a Medicaid card; and children who have applied for Medicaid or [place] Health Choice, but were declined, whose parents can pay a reduced fee at time of service.  [facility] Department of [facility]  [address], [place] [phone] Accepts children up to age 7 who are enrolled in [place] or [place] Health Choice; pregnant women with a Medicaid card; and children who have applied for Medicaid or [place] Health Choice, but were declined, whose parents can pay a  reduced fee at time of service.  [facility]  [address], [place] [phone] Patients are seen by appointment only. Walk-ins are not accepted. [facility] will see patients 27 years of age and older. Monday - Tuesday (8am-5pm) Most Wednesdays (8:30-5pm) $30 per visit, cash only  [facility]  [address], [facility] [place] [phone] Patients are seen by appointment only. Walk-ins are not accepted. [facility] will see patients 27 years of age and older. One Wednesday Evening (Monthly: Volunteer Based).  $30 per visit, cash only  [facility]  [phone]) [phone] for adults; Children under age 524, call Graduate Pediatric Dentistry at [phone]. Children aged 624-14, please call [phone]) [phone] to request a pediatric application.  Dental services are provided in all areas of dental care including fillings, crowns and bridges, complete and partial dentures, implants, gum treatment, root canals, and extractions. Preventive care is  also provided. Treatment is provided to both adults and children. Patients are selected via a lottery and there is often a waiting list.   [facility] [address], [place]  [phone] [email or web address]   [facility] [address], [place], [place] [phone], Ext. [phone] Second and Fourth Thursday of each month, opens at 6:30 AM; Clinic ends at 9 AM.  Patients are seen on a first-come first-served basis, and a limited number are seen during each clinic.   [facility]  [address], [place], [place] [phone]   Eligibility Requirements You must have lived in [place], [place], or [place] counties for at least the last three months.   You cannot be eligible for state or federal sponsored [place] Cityhealthcare insurance, including CIGNAVeterans Administration, [place]icaid, or [employer]icare.   You generally cannot be eligible for healthcare insurance through your employer.    How to apply: Eligibility screenings are held every Tuesday and Wednesday afternoon from 1:00 pm until 4:00 pm. You do not need an appointment for the interview!  [facility] [address], [place], [place] [phone]   [facility]  [phone]   [facility]  [phone]   [facility]  [phone]    Behavioral Health Resources in the Community: Intensive Outpatient Programs Organization         Address  Phone  Notes  [facility] [address], [place], [place] [phone]   [facility] [address], [place], [place] [phone]   [facility] [address], [place], [place]  [phone]   [facility] [address],  [place], [place] [phone] or [phone]   Substance Abuse Resources Organization         Address  Phone  Notes  [facility]  [phone]   [facility]  [phone]   [facility]  [phone]   [facility]  [phone]   Residential & Outpatient Substance Abuse Program  [phone]   Psychological Services Organization         Address  Phone  Notes  [facility]  [phone]- [phone]   [facility]  [phone]- [phone]   [facility] [address], [place] [phone] or [phone]    Mobile Crisis Teams Organization         Address  Phone  Notes  [facility], Mobile Crisis Care Unit  [phone]  [facility]  [address] [place], [place] [phone]   [facility] [address] [place] [phone]    Self-Help/Support Groups Organization         Address  Phone             Notes  [facility] - variety of support groups  [phone] Call for more information  Narcotics Anonymous (NA), [facility] [address], [place]  2 meetings at this location   Statisticianesidential Treatment Programs Organization         Address  Phone  Notes  [facility] [address],    [place]  [phone]   [facility]  [address], [place] [phone], [place], [place] [phone]   [facility] [address], [place] [phone] Admissions: 8am-3pm M-F  [facility] [address]    [place], [place] [phone]   [facility] [address], [place], [place] [phone]   [facility] [address]  [place], [place] [phone]   [facility] - Intensive Outpatient [address], [place], [place] [phone]   [facility] ([facility] [address]  [place], [place] [phone] or [phone]   [facility] ([facility]) [address] [place], [place] [phone] Accepts Medicaid  [facility] [address]  [place] [phone]  Substance Abuse/Addiction Treatment   [facility] Resources Organization         Address  Phone  Notes  [facility]  [phone]) [phone]   Angie FavaJulie Brannon, PhD [address], [address] [place], [place]   [phone] or [phone]   [facility]   [address] [place], [place] [phone]   [facility] [address], [place], [place] [phone] Insurance/Medicaid/sponsorship through [facility]  [facility]ith [facility] Families [address]                                    [place], [place] [phone] Therapy/tele-psych/case  [facility] [address].   [place], [place] [phone]    Dr. Lolly MustacheArfeen  [phone]) [phone]   [facility]  [facility] 1) [address], [place] 2) [address], [place] 3)  [address], [place] [phone]) [phone]  [phone](336) [phone]   [facility] Child Abuse Hotline [phone] or [phone] (After Hours)

## 2014-04-13 NOTE — ED Notes (Signed)
Declined W/C at D/C and was escorted to lobby by RN. 

## 2014-04-13 NOTE — ED Provider Notes (Signed)
CSN: [id number]     Arrival date & time 04/13/14  2101 History  This chart was scribed for Oswaldo ConroyVictoria Ladawna Walgren, PA-C working with No att. providers found by Evon Slackerrance Branch, ED Scribe. This patient was seen in room TR07C/TR07C and the patient's care was started at 10:44 PM.     Chief Complaint  Patient presents with  . Sore Throat    The history is provided by the patient. No language interpreter was used.   HPI Comments: Eileen Clark is a 28 y.o. female who presents to the Emergency Department complaining of sore throat onset 4 days. She states she has associated non productive cough and congestion. She denies any medications PTA. Denies fever, rhinorrhea, nausea or vomiting. Prior smoker, quit in feb. Asthma as a child.   Past Medical History  Diagnosis Date  . Asthma    History reviewed. No pertinent past surgical history. No family history on file. History  Substance Use Topics  . Smoking status: Former Smoker    Types: Cigarettes  . Smokeless tobacco: Not on file  . Alcohol Use: No   OB History   Grav Para Term Preterm Abortions TAB SAB Ect Mult Living                 Review of Systems  Constitutional: Negative for fever and chills.  HENT: Positive for congestion and sore throat. Negative for rhinorrhea.   Respiratory: Positive for cough.   Cardiovascular: Negative for chest pain.  Gastrointestinal: Negative for nausea and vomiting.  All other systems reviewed and are negative.   Allergies  Shellfish allergy  Home Medications   Prior to Admission medications   Medication Sig Start Date End Date Taking? Authorizing Provider  etonogestrel (NEXPLANON) 68 MG IMPL implant Inject 1 each into the skin once.    Historical Provider, MD  ibuprofen (CHILD IBUPROFEN) 100 MG/5ML suspension Take 10 mLs (200 mg total) by mouth every 6 (six) hours as needed. 04/13/14   Louann SjogrenVictoria L Kaveon Blatz, PA-C   Triage vitals: BP 129/78  Pulse 69  Temp(Src) 98.1 F (36.7 C) (Oral)  Resp  18  Ht 5\' 2"  (1.575 m)  Wt 180 lb (81.647 kg)  BMI 32.91 kg/m2  SpO2 100%  LMP 04/01/2014  Physical Exam  Nursing note and vitals reviewed. Constitutional: She appears well-developed and well-nourished. No distress.  HENT:  Head: Normocephalic and atraumatic.  Nose: Right sinus exhibits no maxillary sinus tenderness and no frontal sinus tenderness. Left sinus exhibits no maxillary sinus tenderness and no frontal sinus tenderness.  Mouth/Throat: Mucous membranes are normal. No trismus in the jaw. Posterior oropharyngeal edema and posterior oropharyngeal erythema present. No oropharyngeal exudate.  No uvula deviation.   Eyes: Conjunctivae and EOM are normal. Right eye exhibits no discharge. Left eye exhibits no discharge.  Neck: Normal range of motion. Neck supple.  Cardiovascular: Normal rate, regular rhythm and normal heart sounds.   Pulmonary/Chest: Effort normal and breath sounds normal. No respiratory distress. She has no wheezes. She has no rales.  Abdominal: Soft. Bowel sounds are normal. She exhibits no distension. There is no tenderness.  Lymphadenopathy:    She has no cervical adenopathy.  Neurological: She is alert. She exhibits normal muscle tone. Coordination normal.  Skin: Skin is warm and dry. She is not diaphoretic.    ED Course  Procedures (including critical care time) DIAGNOSTIC STUDIES: Oxygen Saturation is 100% on RA, normal by my interpretation.    COORDINATION OF CARE: 10:58 PM-Discussed treatment plan which includes  ibuprofen and OTC medications with pt at bedside and pt agreed to plan.     Labs Review Labs Reviewed  RAPID STREP SCREEN  CULTURE, GROUP A STREP    Imaging Review No results found.   EKG Interpretation None      MDM   Final diagnoses:  Viral pharyngitis   Pt afebrile without tonsillar exudate, negative strep. Presents with no cervical lymphadenopathy, & dysphagia; diagnosis of viral pharyngitis. No abx indicated. DC w  symptomatic tx for pain  Pt does not appear dehydrated, but did discuss importance of water rehydration. Presentation non concerning for PTA or infxn spread to soft tissue. No trismus or uvula deviation. Specific return precautions discussed. Pt able to drink water in ED without difficulty with intact air way. Patient without a PCP. Patient to establish care and follow up. ED resources provided. Pt appears reliable for follow up and is agreeable to discharge.   Discussed return precautions with patient. Discussed all results and patient verbalizes understanding and agrees with plan.  I personally performed the services described in this documentation, which was scribed in my presence. The recorded information has been reviewed and is accurate.     Louann SjogrenVictoria L Ellese Julius, PA-C 04/13/14 2312  Louann SjogrenVictoria L Davied Nocito, PA-C 04/13/14 2312

## 2014-04-13 NOTE — ED Provider Notes (Signed)
Medical screening examination/treatment/procedure(s) were performed by non-physician practitioner and as supervising physician I was immediately available for consultation/collaboration.   EKG Interpretation None       Keimani Laufer, MD 04/13/14 2349 

## 2014-04-15 LAB — CULTURE, GROUP A STREP

## 2014-04-16 ENCOUNTER — Telehealth (HOSPITAL_BASED_OUTPATIENT_CLINIC_OR_DEPARTMENT_OTHER): Payer: Self-pay | Admitting: Emergency Medicine

## 2014-04-16 NOTE — Telephone Encounter (Signed)
Post ED Visit - Positive Culture Follow-up: Successful Patient Follow-Up  Culture assessed and recommendations reviewed by: []  Wes Dulaney, Pharm.D., BCPS []  Celedonio MiyamotoJeremy Frens, [address] BoulevardPharm.D., BCPS [x]  Georgina PillionElizabeth Martin, Pharm.D., BCPS []  WacoustaMinh Pham, [address] BoulevardPharm.D., BCPS, AAHIVP []  Estella HuskMichelle Turner, Pharm.D., BCPS, AAHIVP []  Red ChristiansSamson Lee, Pharm.D. []  Tennis Mustassie Stewart, Pharm.D.  Positive strep culture  [x]  Patient discharged without antimicrobial prescription and treatment is now indicated []  Organism is resistant to prescribed ED discharge antimicrobial []  Patient with positive blood cultures  Changes discussed with ED provider: Arthor CaptainAbigail Harris PA New antibiotic prescription Penicillin VK 500mg  po bid x 10 days  04/16/14  1741   Left voicemail for callback   Berle MullMiller, Zylan Almquist 04/16/2014, 5:40 PM

## 2014-04-16 NOTE — Progress Notes (Signed)
ED Antimicrobial Stewardship Positive Culture Follow Up   Eileen Clark is an 28 y.o. female who [facility] with a chief complaint of sore throat  Chief Complaint  Patient presents with  . Sore Throat    Recent Results (from the past 720 hour(s))  RAPID STREP SCREEN     Status: None   Collection Time    04/13/14  9:15 PM      Result Value Ref Range Status   Streptococcus, Group A Screen (Direct) NEGATIVE  NEGATIVE Final   Comment: (NOTE)     A Rapid Antigen test may result negative if the antigen level in the     sample is below the detection level of this test. The FDA has not     cleared this test as a stand-alone test therefore the rapid antigen     negative result has reflexed to a Group A Strep culture.  CULTURE, GROUP A STREP     Status: None   Collection Time    04/13/14  9:15 PM      Result Value Ref Range Status   Specimen Description THROAT   Final   Special Requests CX ADDED AT 2136 ON [id number]   Final   Culture     Final   Value: GROUP A STREP (S.PYOGENES) ISOLATED     Performed at [facility]   Report Status 04/15/2014 FINAL   Final    [x]  Patient discharged originally without antimicrobial agent and treatment is now indicated  [name] who presentd on 10/26 with c/o sore throat x 4 days. Rapid strep negative, no pregnancy test done, cultures grew Group A Strep  New antibiotic prescription: Penicillin VK 500 mg bid x 10 days  ED Provider: Arthor CaptainAbigail Harris, PA-C    Rolley SimsMartin, Maleena Eddleman Ann 04/16/2014, 10:04 AM Infectious Diseases Pharmacist Phone# [phone]

## 2014-04-17 ENCOUNTER — Telehealth (HOSPITAL_COMMUNITY): Payer: Self-pay

## 2014-07-29 ENCOUNTER — Inpatient Hospital Stay (HOSPITAL_COMMUNITY)
Admission: AD | Admit: 2014-07-29 | Discharge: 2014-07-29 | Disposition: A | Discharge status: Home / Self Care | Payer: 59 | Source: Ambulatory Visit | Attending: Obstetrics & Gynecology | Admitting: Obstetrics & Gynecology

## 2014-07-29 DIAGNOSIS — Z3202 Encounter for pregnancy test, result negative: Secondary | ICD-10-CM

## 2014-07-29 DIAGNOSIS — Z3200 Encounter for pregnancy test, result unknown: Secondary | ICD-10-CM | POA: Diagnosis present

## 2014-07-29 LAB — POCT PREGNANCY, URINE: PREG TEST UR: NEGATIVE

## 2014-07-29 NOTE — MAU Provider Note (Signed)
Subjective:  [name] is a 29 y.o. female who presents to MAU for a pregnancy test. Her LMP was January 4th; she is 8 days late. She does not have a history of irregular menstrual cycles.    Objecitve:  GENERAL: Well-developed, well-nourished female in no acute distress.  HEENT: Normocephalic, atraumatic.   LUNGS: Effort normal SKIN: Warm, dry and without erythema PSYCH: Normal mood and affect  Filed Vitals:   07/29/14 1906  BP: 139/79  Pulse: 90  Temp: 98 F (36.7 C)  TempSrc: Oral  Resp: 18  Height: 5\' 2"  (1.575 m)  Weight: 82.464 kg (181 lb 12.8 oz)    Results for orders placed or performed during the hospital encounter of 07/29/14 (from the past 48 hour(s))  Pregnancy, urine POC     Status: None   Collection Time: 07/29/14  7:21 PM  Result Value Ref Range   Preg Test, Ur NEGATIVE NEGATIVE    Comment:        THE SENSITIVITY OF THIS METHODOLOGY IS >24 mIU/mL    MDM Pregnancy test negative   Assessment:  1. Encounter for pregnancy test with result negative     Plan:  Discharge home in stable condition Encouraged at home pregnancy testing Return to MAU for emergencies only.   Iona HansenJennifer Irene Rasch, NP 07/29/2014 7:33 PM

## 2014-07-29 NOTE — MAU Note (Signed)
Lower abd cramping for the last week, late for period, neg HPT.  Denies vomiting or diarrhea, no fever.

## 2017-01-22 ENCOUNTER — Inpatient Hospital Stay (HOSPITAL_COMMUNITY)
Admission: AD | Admit: 2017-01-22 | Discharge: 2017-01-22 | Disposition: A | Discharge status: Home / Self Care | Payer: 59 | Source: Ambulatory Visit | Attending: Obstetrics and Gynecology | Admitting: Obstetrics and Gynecology

## 2017-01-22 ENCOUNTER — Encounter (HOSPITAL_COMMUNITY): Payer: Self-pay | Admitting: *Deleted

## 2017-01-22 DIAGNOSIS — N760 Acute vaginitis: Secondary | ICD-10-CM | POA: Insufficient documentation

## 2017-01-22 DIAGNOSIS — Z3202 Encounter for pregnancy test, result negative: Secondary | ICD-10-CM | POA: Diagnosis not present

## 2017-01-22 DIAGNOSIS — N938 Other specified abnormal uterine and vaginal bleeding: Secondary | ICD-10-CM | POA: Diagnosis not present

## 2017-01-22 DIAGNOSIS — B9689 Other specified bacterial agents as the cause of diseases classified elsewhere: Secondary | ICD-10-CM | POA: Diagnosis not present

## 2017-01-22 DIAGNOSIS — Z87891 Personal history of nicotine dependence: Secondary | ICD-10-CM | POA: Insufficient documentation

## 2017-01-22 DIAGNOSIS — N939 Abnormal uterine and vaginal bleeding, unspecified: Secondary | ICD-10-CM | POA: Diagnosis present

## 2017-01-22 LAB — URINALYSIS, ROUTINE W REFLEX MICROSCOPIC
BILIRUBIN URINE: NEGATIVE
Glucose, UA: NEGATIVE mg/dL
KETONES UR: NEGATIVE mg/dL
Leukocytes, UA: NEGATIVE
NITRITE: NEGATIVE
PROTEIN: NEGATIVE mg/dL
Specific Gravity, Urine: 1.02 (ref 1.005–1.030)
pH: 7 (ref 5.0–8.0)

## 2017-01-22 LAB — POCT PREGNANCY, URINE: Preg Test, Ur: NEGATIVE

## 2017-01-22 MED ORDER — METRONIDAZOLE 500 MG PO TABS: 500.0000 mg | ORAL_TABLET | Freq: Two times a day (BID) | ORAL | 0 refills | Status: DC

## 2017-01-22 NOTE — MAU Note (Signed)
Chief Complaint: Vaginal Bleeding and Abdominal Pain   None    SUBJECTIVE HPI: Eileen Clark is a 31 y.o. No obstetric history on file. at Unknown who presents to Maternity Admissions reporting 2 periods in one month.  Bleeding done now.  Uses condoms for birth control.  Usual has monthly periods and bleeds for 5 days.  Had period on 12/29/2016 and than restarted 01/10/2017.  Bleeding finished yesterday.  Also notice a odor prior to period, hx of BV.  No concerns for STD.  Denies pain.  Location: vaginal bleeding Quality:scant Duration: 2 weeks   Past Medical History:  Diagnosis Date  . Asthma    OB History  No data available   History reviewed. No pertinent surgical history. Social History   Social History  . Marital status: Single    Spouse name: N/A  . Number of children: N/A  . Years of education: N/A   Occupational History  . Not on file.   Social History Main Topics  . Smoking status: Former Smoker    Types: Cigarettes  . Smokeless tobacco: Not on file  . Alcohol use No  . Drug use: No  . Sexual activity: Yes    Birth control/ protection: Implant   Other Topics Concern  . Not on file   Social History Narrative  . No narrative on file   History reviewed. No pertinent family history. No current facility-administered medications on file prior to encounter.    Current Outpatient Prescriptions on File Prior to Encounter  Medication Sig Dispense Refill  . etonogestrel (NEXPLANON) 68 MG IMPL implant Inject 1 each into the skin once.    Marland Kitchen ibuprofen (CHILD IBUPROFEN) 100 MG/5ML suspension Take 10 mLs (200 mg total) by mouth every 6 (six) hours as needed. 120 mL 0   Allergies  Allergen Reactions  . Shellfish Allergy Swelling    I have reviewed patient's Past Medical Hx, Surgical Hx, Family Hx, Social Hx, medications and allergies.   Review of Systems  Constitutional: Negative.   HENT: Negative.   Eyes: Negative.   Respiratory: Negative.    Cardiovascular: Negative.   Gastrointestinal: Negative.   Endocrine: Negative.   Genitourinary: Positive for menstrual problem.  Musculoskeletal: Negative.   Skin: Negative.   Allergic/Immunologic: Negative.   Neurological: Negative.   Hematological: Negative.   Psychiatric/Behavioral: Negative.     OBJECTIVE Patient Vitals for the past 24 hrs:  BP Temp Temp src Pulse Resp SpO2 Height Weight  01/22/17 1949 (!) 139/91 98.2 F (36.8 C) Oral 87 17 100 % 5\' 2"  (1.575 m) 81.6 kg (180 lb)   Constitutional: Well-developed, well-nourished female in no acute distress.  Respiratory: normal rate and effort.  GI: Abd soft, non-tender,  MS: Extremities nontender, no edema, normal ROM Neurologic: Alert and oriented x 4.  GU: Neg CVAT.  SPECULUM EXAM: NEFG, physiologic discharge, no blood noted, cervix clean  BIMANUAL: cervix nontender; uterus normal size, no adnexal tenderness or masses.    LAB RESULTS Results for orders placed or performed during the hospital encounter of 01/22/17 (from the past 24 hour(s))  Urinalysis, Routine w reflex microscopic     Status: Abnormal   Collection Time: 01/22/17  7:50 PM  Result Value Ref Range   Color, Urine YELLOW YELLOW   APPearance CLEAR CLEAR   Specific Gravity, Urine 1.020 1.005 - 1.030   pH 7.0 5.0 - 8.0   Glucose, UA NEGATIVE NEGATIVE mg/dL   Hgb urine dipstick SMALL (A) NEGATIVE   Bilirubin Urine  NEGATIVE NEGATIVE   Ketones, ur NEGATIVE NEGATIVE mg/dL   Protein, ur NEGATIVE NEGATIVE mg/dL   Nitrite NEGATIVE NEGATIVE   Leukocytes, UA NEGATIVE NEGATIVE   RBC / HPF 0-5 0 - 5 RBC/hpf   WBC, UA 0-5 0 - 5 WBC/hpf   Bacteria, UA RARE (A) NONE SEEN   Squamous Epithelial / LPF 0-5 (A) NONE SEEN   Mucous PRESENT   Pregnancy, urine POC     Status: None   Collection Time: 01/22/17  8:02 PM  Result Value Ref Range   Preg Test, Ur NEGATIVE NEGATIVE   Wet mount pos clue, neg yeast, neg trich IMAGING No results found.  MAU COURSE Orders  Placed This Encounter  Procedures  . Urinalysis, Routine w reflex microscopic  . Pregnancy, urine POC   No orders of the defined types were placed in this encounter.   MDM PE, wet mount, ua, upt ASSESSMENT DUB BV  PLAN Discharge home in stable condition.  Treat BV with Flagyl 500mg  BID x 7 days.  Discussed normal variations in menses.      Kenney Housemanrothero, Lanetra Hartley Jean, CNM 01/22/2017  10:37 PM

## 2017-01-22 NOTE — MAU Note (Signed)
Pt states she started having vaginal bleeding on 01/13/2017. States this was 2 weeks early for her period. Has continued to bleed since. States the bleeding is bright red and lighter than normal. Pt states she had some sharp pains in LLQ-intermittently. Pt does not have the pain at this time. Pt states she had a -upt at home.

## 2017-12-28 ENCOUNTER — Encounter (HOSPITAL_COMMUNITY): Payer: Self-pay | Admitting: *Deleted

## 2017-12-28 ENCOUNTER — Emergency Department (HOSPITAL_COMMUNITY)
Admission: EM | Admit: 2017-12-28 | Discharge: 2017-12-29 | Disposition: A | Discharge status: Home / Self Care | Payer: 59 | Attending: Emergency Medicine | Admitting: Emergency Medicine

## 2017-12-28 ENCOUNTER — Emergency Department (HOSPITAL_COMMUNITY): Payer: 59

## 2017-12-28 ENCOUNTER — Other Ambulatory Visit: Payer: Self-pay

## 2017-12-28 DIAGNOSIS — J45909 Unspecified asthma, uncomplicated: Secondary | ICD-10-CM | POA: Insufficient documentation

## 2017-12-28 DIAGNOSIS — Z87891 Personal history of nicotine dependence: Secondary | ICD-10-CM | POA: Insufficient documentation

## 2017-12-28 DIAGNOSIS — J039 Acute tonsillitis, unspecified: Secondary | ICD-10-CM | POA: Diagnosis not present

## 2017-12-28 DIAGNOSIS — R6884 Jaw pain: Secondary | ICD-10-CM | POA: Diagnosis present

## 2017-12-28 LAB — BASIC METABOLIC PANEL
ANION GAP: 7 (ref 5–15)
BUN: 7 mg/dL (ref 6–20)
CALCIUM: 9.6 mg/dL (ref 8.9–10.3)
CHLORIDE: 109 mmol/L (ref 98–111)
CO2: 25 mmol/L (ref 22–32)
Creatinine, Ser: 1.02 mg/dL — ABNORMAL HIGH (ref 0.44–1.00)
GFR calc non Af Amer: >60 mL/min (ref >60)
Glucose, Bld: 84 mg/dL (ref 70–99)
Potassium: 3.7 mmol/L (ref 3.5–5.1)
Sodium: 141 mmol/L (ref 135–145)

## 2017-12-28 LAB — CBC WITH DIFFERENTIAL/PLATELET
BASOS ABS: 0 10*3/uL (ref 0.0–0.1)
Basophils Relative: 0 %
Eosinophils Absolute: 0.3 10*3/uL (ref 0.0–0.7)
Eosinophils Relative: 2 %
HEMATOCRIT: 39.7 % (ref 36.0–46.0)
HEMOGLOBIN: 12.2 g/dL (ref 12.0–15.0)
LYMPHS PCT: 42 %
Lymphs Abs: 5.3 10*3/uL — ABNORMAL HIGH (ref 0.7–4.0)
MCH: 21 pg — ABNORMAL LOW (ref 26.0–34.0)
MCHC: 30.7 g/dL (ref 30.0–36.0)
MCV: 68.4 fL — ABNORMAL LOW (ref 78.0–100.0)
MONOS PCT: 7 %
Monocytes Absolute: 0.9 10*3/uL (ref 0.1–1.0)
NEUTROS PCT: 49 %
Neutro Abs: 6 10*3/uL (ref 1.7–7.7)
Platelets: 275 10*3/uL (ref 150–400)
RBC: 5.8 MIL/uL — AB (ref 3.87–5.11)
RDW: 15.1 % (ref 11.5–15.5)
WBC: 12.5 10*3/uL — AB (ref 4.0–10.5)

## 2017-12-28 LAB — I-STAT BETA HCG BLOOD, ED (MC, WL, AP ONLY): I-stat hCG, quantitative: <5 m[IU]/mL (ref <5)

## 2017-12-28 LAB — GROUP A STREP BY PCR: Group A Strep by PCR: NOT DETECTED

## 2017-12-28 MED ORDER — IOHEXOL 300 MG/ML  SOLN
100.0000 mL | Freq: Once | INTRAMUSCULAR | Status: AC | PRN
  Administered 2017-12-28: 100 mL via INTRAVENOUS

## 2017-12-28 MED ORDER — DEXAMETHASONE SODIUM PHOSPHATE 10 MG/ML IJ SOLN
10.0000 mg | Freq: Once | INTRAMUSCULAR | Status: AC
  Administered 2017-12-28: 10 mg via INTRAVENOUS
  Filled 2017-12-28: qty 1

## 2017-12-28 MED ORDER — SODIUM CHLORIDE 0.9 % IV BOLUS
1000.0000 mL | Freq: Once | INTRAVENOUS | Status: AC
  Administered 2017-12-28: 1000 mL via INTRAVENOUS

## 2017-12-28 MED ORDER — CLINDAMYCIN HCL 150 MG PO CAPS
450.0000 mg | ORAL_CAPSULE | Freq: Once | ORAL | Status: AC
  Administered 2017-12-28: 450 mg via ORAL
  Filled 2017-12-28: qty 3

## 2017-12-28 NOTE — ED Notes (Signed)
PA at bedside.

## 2017-12-28 NOTE — ED Notes (Signed)
Pt transported to CT ?

## 2017-12-28 NOTE — ED Notes (Signed)
Due to patient's current orders, will upgrade acuity

## 2017-12-28 NOTE — ED Triage Notes (Signed)
The pt is c/o face pain for 2 days she reports that her pain increases whenever she opens her mouth  lmp 2 weeks ago

## 2017-12-29 MED ORDER — CLINDAMYCIN HCL 150 MG PO CAPS: 300.0000 mg | ORAL_CAPSULE | Freq: Three times a day (TID) | ORAL | 0 refills | Status: AC

## 2017-12-29 NOTE — ED Provider Notes (Signed)
[facility] Provider Note   CSN: [id number] Arrival date & time: 12/28/17  1824     History   Chief Complaint Chief Complaint  Patient presents with  . Facial Swelling    HPI Eileen Clark is a 32 y.o. female who presents today for evaluation of facial swelling and pain with opening her jaw.  She reports that this is been going on for 2 days.  She denies dental pain, however does report that she had a filling fall out earlier in this year and has not gotten that fixed.  She denies any fevers.  No difficulty swallowing, however she reports that she is not able to fully open her mouth.  HPI  Past Medical History:  Diagnosis Date  . Asthma     There are no active problems to display for this patient.   History reviewed. No pertinent surgical history.   OB History   None      Home Medications    Prior to Admission medications   Medication Sig Start Date End Date Taking? Authorizing Provider  clindamycin (CLEOCIN) 150 MG capsule Take 2 capsules (300 mg total) by mouth 3 (three) times daily for 10 days. 12/29/17 01/08/18  Cristina GongHammond, Prashant Glosser W, PA-C  metroNIDAZOLE (FLAGYL) 500 MG tablet Take 1 tablet (500 mg total) by mouth 2 (two) times daily. 01/22/17   Prothero, Henderson NewcomerNancy Jean, CNM    Family History No family history on file.  Social History Social History   Tobacco Use  . Smoking status: Former Smoker    Types: Cigarettes  . Smokeless tobacco: Never Used  Substance Use Topics  . Alcohol use: No  . Drug use: No     Allergies   Shellfish allergy   Review of Systems Review of Systems  Constitutional: Negative for chills and fever.  HENT: Positive for dental problem, facial swelling and sore throat. Negative for trouble swallowing.   All other systems reviewed and are negative.    Physical Exam Updated Vital Signs BP 129/79   Pulse 80   Temp 98.2 F (36.8 C) (Oral)   Resp 18   Ht 5\' 2"  (1.575 m)   Wt 86.2 kg  (190 lb)   LMP 12/14/2017   SpO2 97%   BMI 34.75 kg/m   Physical Exam  Constitutional: She appears well-developed and well-nourished. No distress.  HENT:  Head: Normocephalic and atraumatic.  No obvious external facial swelling.  There is significant trismus when attempting oropharyngeal exam.  3+ tonsils bilaterally without exudates.  Eyes: Conjunctivae are normal. Right eye exhibits no discharge. Left eye exhibits no discharge. No scleral icterus.  Neck: Normal range of motion. Neck supple.  Cardiovascular: Normal rate and regular rhythm.  Pulmonary/Chest: Effort normal. No stridor. No respiratory distress.  Abdominal: She exhibits no distension.  Musculoskeletal: She exhibits no edema or deformity.  Lymphadenopathy:    She has no cervical adenopathy.  Neurological: She is alert. She exhibits normal muscle tone.  Skin: Skin is warm and dry. She is not diaphoretic.  Psychiatric: She has a normal mood and affect. Her behavior is normal.  Nursing note and vitals reviewed.    ED Treatments / Results  Labs (all labs ordered are listed, but only abnormal results are displayed) Labs Reviewed  CBC WITH DIFFERENTIAL/PLATELET - Abnormal; Notable for the following components:      Result Value   WBC 12.5 (*)    RBC 5.80 (*)    MCV 68.4 (*)  MCH 21.0 (*)    Lymphs Abs 5.3 (*)    All other components within normal limits  BASIC METABOLIC PANEL - Abnormal; Notable for the following components:   Creatinine, Ser 1.02 (*)    All other components within normal limits  GROUP A STREP BY PCR  I-STAT BETA HCG BLOOD, ED ([facility], [facility], [facility] ONLY)    EKG None  Radiology Ct Soft Tissue Neck W Contrast  Result Date: 12/28/2017 CLINICAL DATA:  Sore throat and stridor with EXAM: CT NECK WITH CONTRAST TECHNIQUE: Multidetector CT imaging of the neck was performed using the standard protocol following the bolus administration of intravenous contrast. CONTRAST:  OMNIPAQUE IOHEXOL 300 MG/ML  SOLN  COMPARISON:  None. FINDINGS: PHARYNX AND LARYNX: --Nasopharynx: Fossae of [place]mull[place] are clear. Normal adenoid tonsils for age. --Oral cavity and oropharynx: Palatine tonsils are mildly enlarged. No peritonsillar fluid collection. Mild prominence of the lingual tonsils. --Hypopharynx: Normal vallecula and pyriform sinuses. --Larynx: Normal epiglottis and pre-epiglottic space. Normal aryepiglottic and vocal folds. --Retropharyngeal space: No abscess, effusion or lymphadenopathy. SALIVARY GLANDS: --Parotid: No mass lesion or inflammation. No sialolithiasis or ductal dilatation. --Submandibular: Symmetric without inflammation. No sialolithiasis or ductal dilatation. --Sublingual: Normal. No ranula or other visible lesion of the base of tongue and floor of mouth. THYROID: Normal. LYMPH NODES: No enlarged or abnormal density lymph nodes. VASCULAR: Major cervical vessels are patent. LIMITED INTRACRANIAL: Normal. VISUALIZED ORBITS: Normal. MASTOIDS AND VISUALIZED PARANASAL SINUSES: No fluid levels or advanced mucosal thickening. No mastoid effusion. SKELETON: No bony spinal canal stenosis. No lytic or blastic lesions. UPPER CHEST: Clear. OTHER: None. IMPRESSION: 1. Mildly enlarged palatine tonsils without peritonsillar abscess or fluid collection. Findings are compatible with acute tonsillopharyngitis. 2. Normal epiglottis.  No retropharyngeal abnormality. Electronically Signed   By: Deatra Robinson M.D.   On: 12/28/2017 23:04    Procedures Procedures (including critical care time)  Medications Ordered in ED Medications  sodium chloride 0.9 % bolus 1,000 mL (0 mLs Intravenous Stopped 12/28/17 2203)  dexamethasone (DECADRON) injection 10 mg (10 mg Intravenous Given 12/28/17 2120)  iohexol (OMNIPAQUE) 300 MG/ML solution 100 mL (100 mLs Intravenous Contrast Given 12/28/17 2215)  clindamycin (CLEOCIN) capsule 450 mg (450 mg Oral Given 12/28/17 2343)     Initial Impression / Assessment and Plan / ED Course  I have  reviewed the triage vital signs and the nursing notes.  Pertinent labs & imaging results that were available during my care of the patient were reviewed by me and considered in my medical decision making (see chart for details).    Patient is a 32 year old woman who presents today for trismus and reported facial swelling.  Objectively on my exam I do not feel or see any facial swelling, however she does have significant trismus.  She did have a filling fall out earlier in the year.  There is no obvious drainable abscess on my exam.  Based on the degree of trismus, concern for retropharyngeal swelling or infection.  She does have large tonsils bilaterally without exudate present.  She was treated with Decadron, and given 1 L IV fluids.  Labs were obtained and reviewed, white count is mildly elevated.  CT soft tissue neck was obtained which was read as consistent with acute tonsillopharyngitis.  She was given a dose of p.o. clindamycin here with a prescription for clindamycin at home, as this will cover for dental and other oral microbes.    She was instructed that she needs to make sure she is remaining  well-hydrated.  She was given ENT follow-up and return precautions.  She stated her understanding.  Discharged home.   Final Clinical Impressions(s) / ED Diagnoses   Final diagnoses:  Tonsillitis    ED Discharge Orders        Ordered    clindamycin (CLEOCIN) 150 MG capsule  3 times daily     12/29/17 0028       Cristina Gong, PA-C 12/29/17 0105    Blane Ohara, MD 01/01/18 [phone]

## 2017-12-29 NOTE — Discharge Instructions (Addendum)
Please make sure you stay well hydrated.    Please take Ibuprofen (Advil, motrin) and Tylenol (acetaminophen) to relieve your pain.  You may take up to 600 MG (3 pills) of normal strength ibuprofen every 8 hours as needed.  In between doses of ibuprofen you make take tylenol, up to 1,000 mg (two extra strength pills).  Do not take more than 3,000 mg tylenol in a 24 hour period.  Please check all medication labels as many medications such as pain and cold medications may contain tylenol.  Do not drink alcohol while taking these medications.  Do not take other NSAID'S while taking ibuprofen (such as aleve or naproxen).  Please take ibuprofen with food to decrease stomach upset.  You may have diarrhea from the antibiotics.  It is very important that you continue to take the antibiotics even if you get diarrhea unless a medical professional tells you that you may stop taking them.  If you stop too early the bacteria you are being treated for will become stronger and you may need different, more powerful antibiotics that have more side effects and worsening diarrhea.  Please stay well hydrated and consider probiotics as they may decrease the severity of your diarrhea.  Please be aware that if you take any hormonal contraception (birth control pills, nexplanon, the ring, etc) that your birth control will not work while you are taking antibiotics and you need to use back up protection as directed on the birth control medication information insert.

## 2018-12-01 IMAGING — CT CT NECK W/ CM
4 of 6 series · 12 of 33 positions shown, 14 images · IV contrast (APPLIED)
Comparison: None.

CLINICAL DATA: Sore throat and stridor with

EXAM:
CT NECK WITH CONTRAST
TECHNIQUE: Multidetector CT imaging of the neck was performed using the
standard protocol following the bolus administration of intravenous
contrast.
CONTRAST:  100mL OMNIPAQUE IOHEXOL 300 MG/ML  SOLN

[Series 6: axial bone · axial · 0.42mm/px · z∈[-264,-180]mm · 2 of 128 slices shown, 3 images]
[im 43/128  soft-tissue]
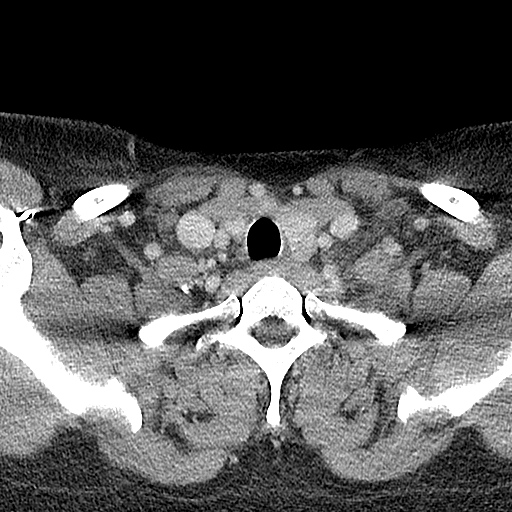
[im 43/128  bone]
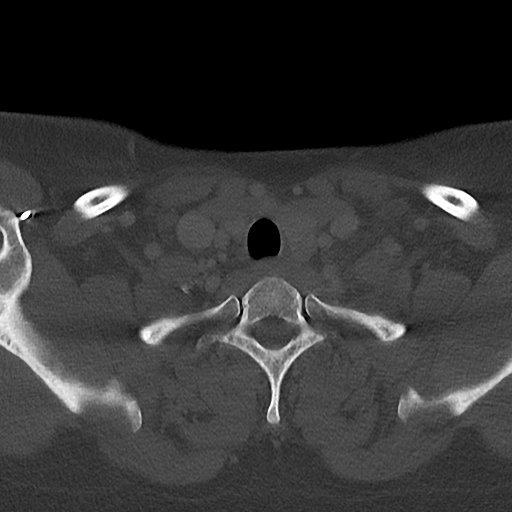
[im 85/128  bone]
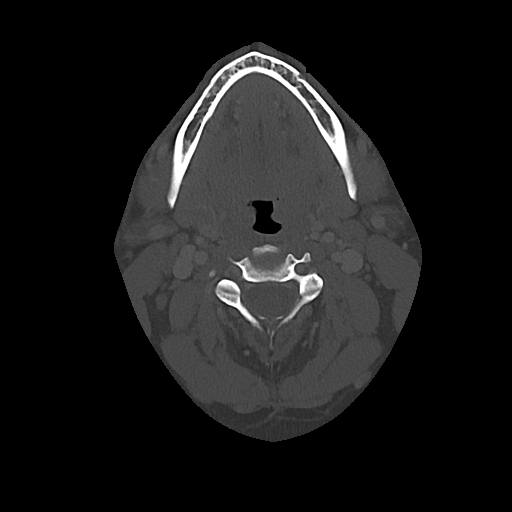

[Series 7: sag neck · sagittal · 0.41mm/px · 5 of 101 slices shown, 6 images]
[im 34/101  bone]
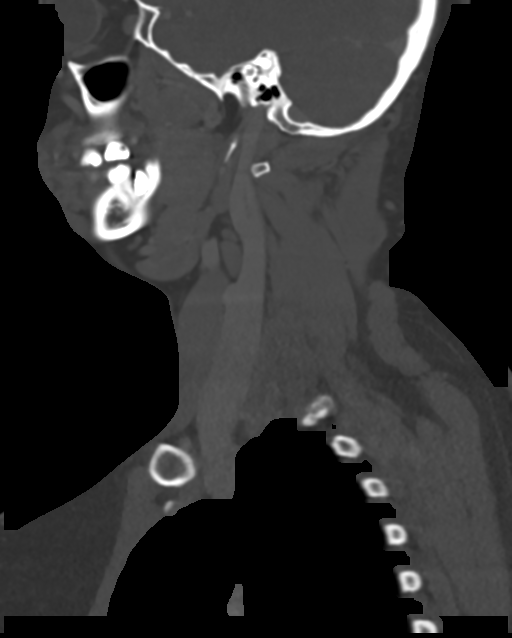
[im 42/101  bone]
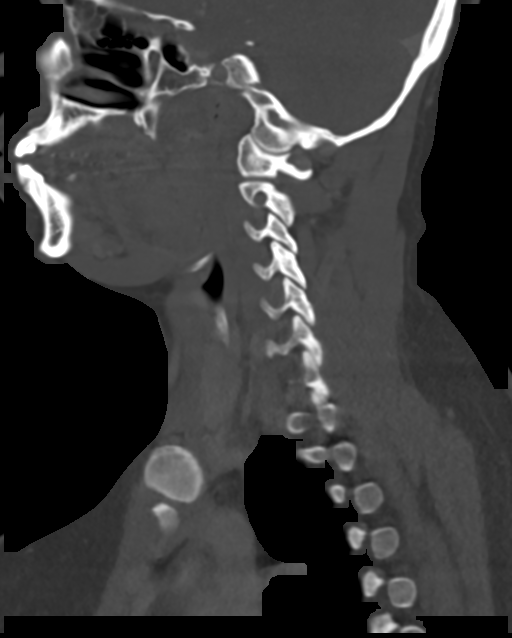
[im 51/101  soft-tissue]
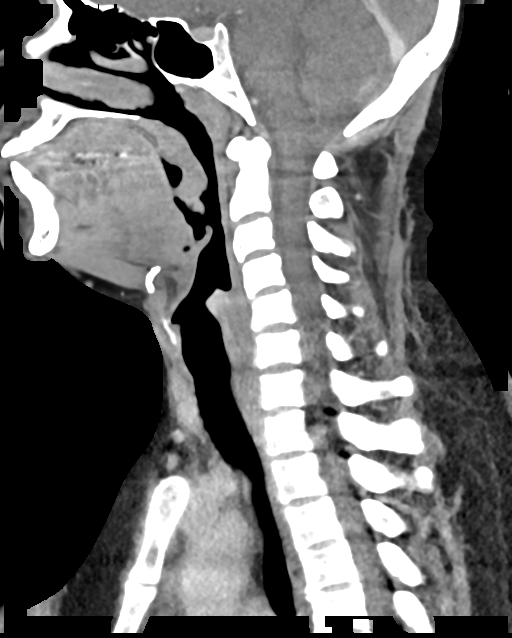
[im 51/101  bone]
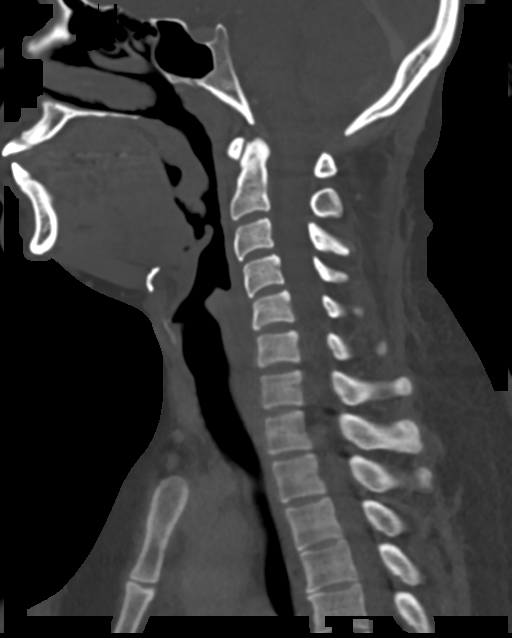
[im 59/101  bone]
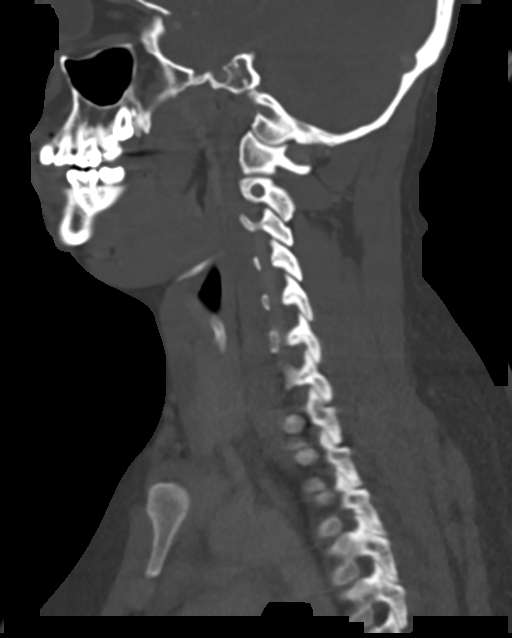
[im 67/101  bone]
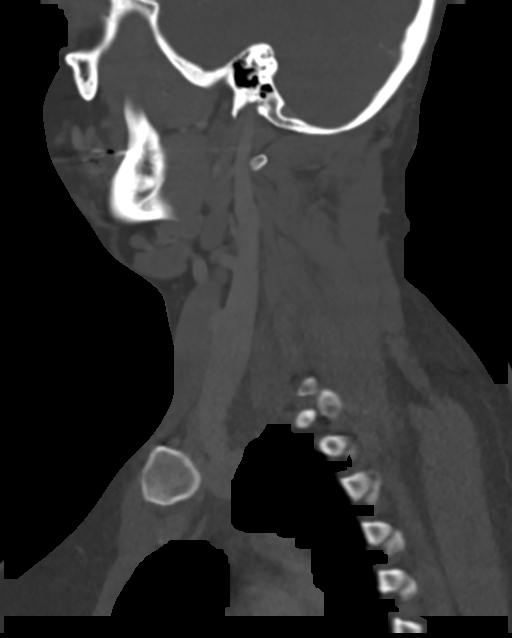

[Series 8: cor neck · coronal · 0.41mm/px · 3 of 101 slices shown]
[im 21/101  bone]
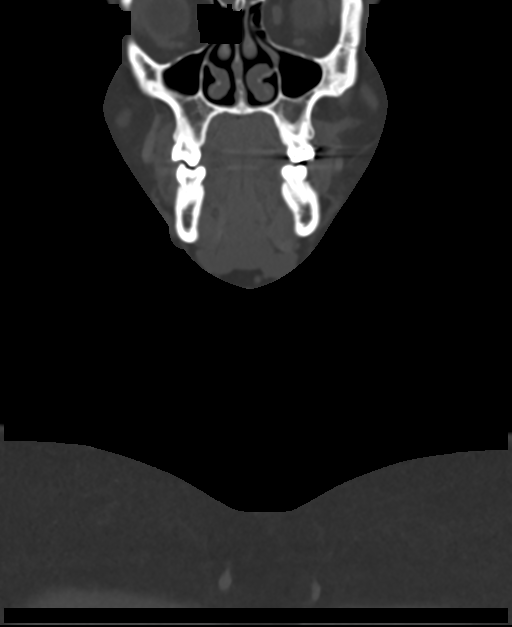
[im 41/101  bone]
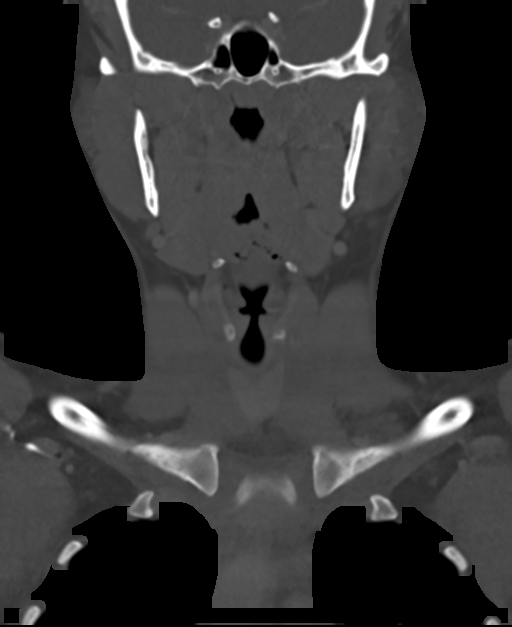
[im 61/101  bone]
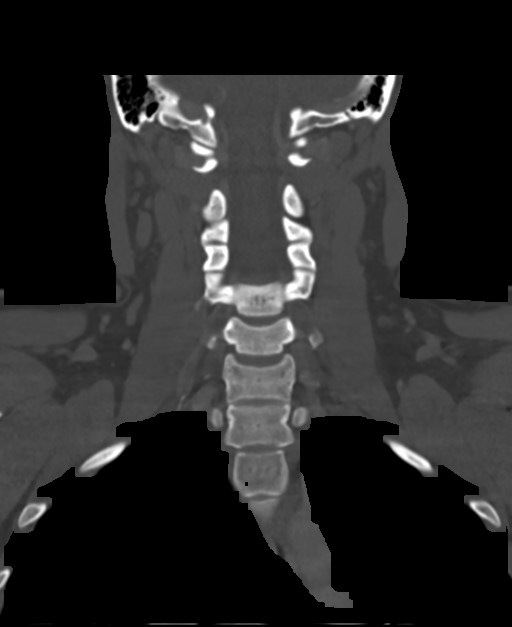

[Series 9: ax oropharynx · axial · 0.39mm/px · z∈[-264,-180]mm · 2 of 128 slices shown]
[im 43/128  bone]
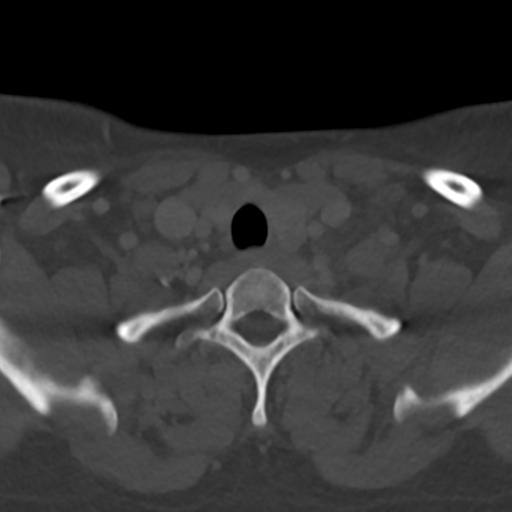
[im 85/128  bone]
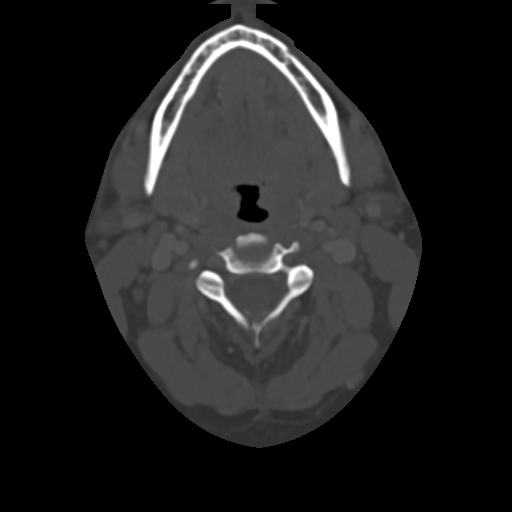

[12 of 33 positions shown; findings below may reference images not displayed]

FINDINGS: PHARYNX AND LARYNX:

--Nasopharynx: Fossae of Jim are clear. Normal adenoid
tonsils for age.

--Oral cavity and oropharynx: Palatine tonsils are mildly enlarged.
No peritonsillar fluid collection. Mild prominence of the lingual
tonsils.

--Hypopharynx: Normal vallecula and pyriform sinuses.

--Larynx: Normal epiglottis and pre-epiglottic space. Normal
aryepiglottic and vocal folds.

--Retropharyngeal space: No abscess, effusion or lymphadenopathy.

SALIVARY GLANDS:

--Parotid: No mass lesion or inflammation. No sialolithiasis or
ductal dilatation.

--Submandibular: Symmetric without inflammation. No sialolithiasis
or ductal dilatation.

--Sublingual: Normal. No ranula or other visible lesion of the base
of tongue and floor of mouth.

THYROID: Normal.

LYMPH NODES: No enlarged or abnormal density lymph nodes.

VASCULAR: Major cervical vessels are patent.

LIMITED INTRACRANIAL: Normal.

VISUALIZED ORBITS: Normal.

MASTOIDS AND VISUALIZED PARANASAL SINUSES: No fluid levels or
advanced mucosal thickening. No mastoid effusion.

SKELETON: No bony spinal canal stenosis. No lytic or blastic
lesions.

UPPER CHEST: Clear.

OTHER: None.
IMPRESSION: 1. Mildly enlarged palatine tonsils without peritonsillar abscess or
fluid collection. Findings are compatible with acute
tonsillopharyngitis.
2. Normal epiglottis.  No retropharyngeal abnormality.

## 2020-02-20 ENCOUNTER — Ambulatory Visit (HOSPITAL_COMMUNITY)
Admission: EM | Admit: 2020-02-20 | Discharge: 2020-02-20 | Disposition: A | Discharge status: Home / Self Care | Payer: 59 | Attending: Family Medicine | Admitting: Family Medicine

## 2020-02-20 ENCOUNTER — Other Ambulatory Visit: Payer: Self-pay

## 2020-02-20 ENCOUNTER — Encounter (HOSPITAL_COMMUNITY): Payer: Self-pay

## 2020-02-20 DIAGNOSIS — M542 Cervicalgia: Secondary | ICD-10-CM

## 2020-02-20 DIAGNOSIS — R519 Headache, unspecified: Secondary | ICD-10-CM

## 2020-02-20 MED ORDER — PREDNISONE 10 MG PO TABS: ORAL_TABLET | ORAL | 0 refills | Status: DC

## 2020-02-20 MED ORDER — CETIRIZINE HCL 10 MG PO TABS: 10.0000 mg | ORAL_TABLET | Freq: Every day | ORAL | 0 refills | Status: DC

## 2020-02-20 MED ORDER — FLUTICASONE PROPIONATE 50 MCG/ACT NA SUSP
1.0000 | Freq: Two times a day (BID) | NASAL | 0 refills | Status: DC
Start: 2020-02-20 — End: 2020-03-17

## 2020-02-20 NOTE — ED Triage Notes (Signed)
Pt presents with left side neck pain with pressure of left side of face X 4 days.

## 2020-02-20 NOTE — ED Provider Notes (Signed)
[facility]    CSN: [id number] Arrival date & time: 02/20/20  0848      History   Chief Complaint Chief Complaint  Patient presents with  . Neck Pain  . Head / Face Pressure    HPI Eileen Clark is a 34 y.o. female.   Started Tuesday with left sided neck pain that is now also radiating up into face and now pressure/tingling in head. Left ear feels full and has lots of pressure now as well. No pain radiating down arm, numbness, congestion, sore throat, cough, fever, CP, SOB. Tried excedrin one day with minimal relief. Does note she has a hx of seasonal allergies the past two years but does not take anything for this. Denies sick contacts, recent travel.      Past Medical History:  Diagnosis Date  . Asthma     There are no problems to display for this patient.   History reviewed. No pertinent surgical history.  OB History   No obstetric history on file.      Home Medications    Prior to Admission medications   Medication Sig Start Date End Date Taking? Authorizing Provider  cetirizine (ZYRTEC ALLERGY) 10 MG tablet Take 1 tablet (10 mg total) by mouth daily. 02/20/20   Particia Nearing, PA-C  fluticasone [facility]) 50 MCG/ACT nasal spray Place 1 spray into both nostrils 2 (two) times daily. 02/20/20   Particia Nearing, PA-C  metroNIDAZOLE (FLAGYL) 500 MG tablet Take 1 tablet (500 mg total) by mouth 2 (two) times daily. 01/22/17   Prothero, Henderson Newcomer, CNM  predniSONE (DELTASONE) 10 MG tablet Take 6 tabs day one, 5 tabs day two, 4 tabs day three, etc 02/20/20   Particia Nearing, PA-C    Family History Family History  Family history unknown: Yes    Social History Social History   Tobacco Use  . Smoking status: Former Smoker    Types: Cigarettes  . Smokeless tobacco: Never Used  Substance Use Topics  . Alcohol use: No  . Drug use: No     Allergies   Shellfish allergy   Review of Systems Review of Systems Per  HPI   Physical Exam Triage Vital Signs ED Triage Vitals  Enc Vitals Group     BP 02/20/20 0958 (!) 144/100     Pulse Rate 02/20/20 0958 84     Resp 02/20/20 0958 17     Temp 02/20/20 0958 98.4 F (36.9 C)     Temp Source 02/20/20 0958 Oral     SpO2 02/20/20 0958 98 %     Weight --      Height --      Head Circumference --      Peak Flow --      Pain Score 02/20/20 0957 7     Pain Loc --      Pain Edu? --      Excl. in GC? --    No data found.  Updated Vital Signs BP (!) 144/100 (BP Location: Left Arm)   Pulse 84   Temp 98.4 F (36.9 C) (Oral)   Resp 17   SpO2 98%   Visual Acuity Right Eye Distance:   Left Eye Distance:   Bilateral Distance:    Right Eye Near:   Left Eye Near:    Bilateral Near:     Physical Exam Vitals and nursing note reviewed.  Constitutional:      Appearance: Normal appearance. She is not  ill-appearing.  HENT:     Head: Atraumatic.     Comments: Left maxillary sinus ttp    Ears:     Comments: Moderate left middle ear effusion, no TM injection, purulent fluid    Nose:     Comments: Nasal mucosa erythematous and boggy     Mouth/Throat:     Mouth: Mucous membranes are moist.     Pharynx: Posterior oropharyngeal erythema present.     Comments: Significant tonsillar edema, which per patient is chronic and unchanged Eyes:     Extraocular Movements: Extraocular movements intact.     Conjunctiva/sclera: Conjunctivae normal.  Cardiovascular:     Rate and Rhythm: Normal rate and regular rhythm.     Heart sounds: Normal heart sounds.  Pulmonary:     Effort: Pulmonary effort is normal.     Breath sounds: Normal breath sounds.  Musculoskeletal:        General: No swelling or tenderness. Normal range of motion.     Cervical back: Normal range of motion and neck supple.  Skin:    General: Skin is warm and dry.     Findings: No erythema or rash.  Neurological:     Mental Status: She is alert and oriented to person, place, and time.   Psychiatric:        Mood and Affect: Mood normal.        Thought Content: Thought content normal.        Judgment: Judgment normal.      UC Treatments / Results  Labs (all labs ordered are listed, but only abnormal results are displayed) Labs Reviewed - No data to display  EKG   Radiology No results found.  Procedures Procedures (including critical care time)  Medications Ordered in UC Medications - No data to display  Initial Impression / Assessment and Plan / UC Course  I have reviewed the triage vital signs and the nursing notes.  Pertinent labs & imaging results that were available during my care of the patient were reviewed by me and considered in my medical decision making (see chart for details).     Suspect sxs related to allergies/inflammation - start prednisone taper, zyrtec and Flonase regimen ongoing for allergy control. Strict return precautions reviewed for changing or worsening sxs. Rest, fluids, saline sinus rinses recommended.   Final Clinical Impressions(s) / UC Diagnoses   Final diagnoses:  Neck pain  Left facial pressure and pain   Discharge Instructions   None    ED Prescriptions    Medication Sig Dispense Auth. Provider   cetirizine (ZYRTEC ALLERGY) 10 MG tablet Take 1 tablet (10 mg total) by mouth daily. 30 tablet Particia Nearing, PA-C   fluticasone [facility]) 50 MCG/ACT nasal spray Place 1 spray into both nostrils 2 (two) times daily. 16 g Particia Nearing, [place]   predniSONE (DELTASONE) 10 MG tablet Take 6 tabs day one, 5 tabs day two, 4 tabs day three, etc 21 tablet Particia Nearing, [place]     PDMP not reviewed this encounter.   Particia Nearing, [place] 02/20/20 1045

## 2020-02-21 ENCOUNTER — Other Ambulatory Visit: Payer: Self-pay

## 2020-02-21 ENCOUNTER — Ambulatory Visit (HOSPITAL_COMMUNITY)
Admission: EM | Admit: 2020-02-21 | Discharge: 2020-02-21 | Disposition: A | Discharge status: Home / Self Care | Payer: 59 | Attending: Family Medicine | Admitting: Family Medicine

## 2020-02-21 DIAGNOSIS — Z20822 Contact with and (suspected) exposure to covid-19: Secondary | ICD-10-CM | POA: Diagnosis present

## 2020-02-21 DIAGNOSIS — Z0189 Encounter for other specified special examinations: Secondary | ICD-10-CM | POA: Diagnosis not present

## 2020-02-21 NOTE — ED Triage Notes (Signed)
Pt presents with c/o needing COVID test to travel to [place] this coming Tuesday. Pt denies any current symptoms.

## 2020-02-22 LAB — SARS CORONAVIRUS 2 (TAT 6-24 HRS): SARS Coronavirus 2: NEGATIVE

## 2020-03-17 ENCOUNTER — Ambulatory Visit (HOSPITAL_COMMUNITY)
Admission: EM | Admit: 2020-03-17 | Discharge: 2020-03-17 | Disposition: A | Discharge status: Home / Self Care | Payer: 59 | Attending: Family Medicine | Admitting: Family Medicine

## 2020-03-17 ENCOUNTER — Telehealth (HOSPITAL_COMMUNITY): Payer: Self-pay | Admitting: Family Medicine

## 2020-03-17 ENCOUNTER — Other Ambulatory Visit: Payer: Self-pay

## 2020-03-17 ENCOUNTER — Encounter (HOSPITAL_COMMUNITY): Payer: Self-pay | Admitting: *Deleted

## 2020-03-17 DIAGNOSIS — R519 Headache, unspecified: Secondary | ICD-10-CM | POA: Insufficient documentation

## 2020-03-17 DIAGNOSIS — R5383 Other fatigue: Secondary | ICD-10-CM | POA: Diagnosis present

## 2020-03-17 LAB — CBC WITH DIFFERENTIAL/PLATELET
Abs Immature Granulocytes: 0.02 10*3/uL (ref 0.00–0.07)
Basophils Absolute: 0 10*3/uL (ref 0.0–0.1)
Basophils Relative: 1 %
Eosinophils Absolute: 0 10*3/uL (ref 0.0–0.5)
Eosinophils Relative: 1 %
HCT: 41.8 % (ref 36.0–46.0)
Hemoglobin: 13 g/dL (ref 12.0–15.0)
Immature Granulocytes: 1 %
Lymphocytes Relative: 29 %
Lymphs Abs: 1.3 10*3/uL (ref 0.7–4.0)
MCH: 21.6 pg — ABNORMAL LOW (ref 26.0–34.0)
MCHC: 31.1 g/dL (ref 30.0–36.0)
MCV: 69.3 fL — ABNORMAL LOW (ref 80.0–100.0)
Monocytes Absolute: 0.5 10*3/uL (ref 0.1–1.0)
Monocytes Relative: 12 %
Neutro Abs: 2.6 10*3/uL (ref 1.7–7.7)
Neutrophils Relative %: 56 %
Platelets: 261 10*3/uL (ref 150–400)
RBC: 6.03 MIL/uL — ABNORMAL HIGH (ref 3.87–5.11)
RDW: 16.2 % — ABNORMAL HIGH (ref 11.5–15.5)
WBC: 4.4 10*3/uL (ref 4.0–10.5)
nRBC: 0 % (ref 0.0–0.2)

## 2020-03-17 LAB — COMPREHENSIVE METABOLIC PANEL
ALT: 17 U/L (ref 0–44)
AST: 19 U/L (ref 15–41)
Albumin: 4.2 g/dL (ref 3.5–5.0)
Alkaline Phosphatase: 77 U/L (ref 38–126)
Anion gap: 9 (ref 5–15)
BUN: 6 mg/dL (ref 6–20)
CO2: 24 mmol/L (ref 22–32)
Calcium: 9.4 mg/dL (ref 8.9–10.3)
Chloride: 105 mmol/L (ref 98–111)
Creatinine, Ser: 1.17 mg/dL — ABNORMAL HIGH (ref 0.44–1.00)
GFR calc Af Amer: >60 mL/min (ref >60)
GFR calc non Af Amer: >60 mL/min (ref >60)
Glucose, Bld: 118 mg/dL — ABNORMAL HIGH (ref 70–99)
Potassium: 4.1 mmol/L (ref 3.5–5.1)
Sodium: 138 mmol/L (ref 135–145)
Total Bilirubin: 0.4 mg/dL (ref 0.3–1.2)
Total Protein: 7.9 g/dL (ref 6.5–8.1)

## 2020-03-17 LAB — HEMOGLOBIN A1C
Hgb A1c MFr Bld: 6.1 % — ABNORMAL HIGH (ref 4.8–5.6)
Mean Plasma Glucose: 128.37 mg/dL

## 2020-03-17 LAB — VITAMIN B12: Vitamin B-12: 428 pg/mL (ref 180–914)

## 2020-03-17 LAB — VITAMIN D 25 HYDROXY (VIT D DEFICIENCY, FRACTURES): Vit D, 25-Hydroxy: 11.97 ng/mL — ABNORMAL LOW (ref 30–100)

## 2020-03-17 MED ORDER — AMOXICILLIN-POT CLAVULANATE 875-125 MG PO TABS: 1.0000 | ORAL_TABLET | Freq: Two times a day (BID) | ORAL | 0 refills | Status: DC

## 2020-03-17 MED ORDER — VITAMIN D (ERGOCALCIFEROL) 1.25 MG (50000 UNIT) PO CAPS: 50000.0000 [IU] | ORAL_CAPSULE | ORAL | 0 refills | Status: AC

## 2020-03-17 NOTE — Telephone Encounter (Signed)
Reviewed lab work with patient.  Sending in vitamin D for replacement Recommend follow-up with primary care in 6 to 8 weeks for recheck

## 2020-03-17 NOTE — Discharge Instructions (Addendum)
Checking some blood work. I will call you when I get the results.  Vitamin C, Vitamin D and Zince Multivitamin Antibiotics as prescribed.  Flonase and zyrtec daily.   Call the post covid clinic  [phone]

## 2020-03-17 NOTE — ED Notes (Signed)
No answer x1

## 2020-03-17 NOTE — ED Triage Notes (Signed)
PT returns today because her sinus infection is not better. Pt reports pain to facial area ,head,neck . On the last visit to UC 4 weeks ago she was told she had a sinus infection. Pt also reports having brain fog. Pt had COVID-19 in Jan 21.  Pt reports she thinks the Sx's today are on going from COVID. Pain is 9/10.

## 2020-03-18 NOTE — ED Provider Notes (Signed)
[facility]    CSN: [id number] Arrival date & time: 03/17/20  1025      History   Chief Complaint Chief Complaint  Patient presents with  . Headache    HPI Eileen Clark is a 34 y.o. female.   Pt is a 34 year old female that presents with multiple symptoms. Reporting some sinus pressure, headache, recently treated for sinus infection. Reporting that she lost her abx.  She did finish the prednisone prescription and has been taking allergy medication.  Reporting just overall feeling fatigue, dizzy from time to time.  Patient had Covid in January 2021.  Has been having lingering off-and-on symptoms since then.  Does have history of low vitamin D level and pre diabetes.  No fever, chills, nausea, vomiting, diarrhea, increased thirst, urinary frequency.     Past Medical History:  Diagnosis Date  . Asthma     Patient Active Problem List   Diagnosis Date Noted  . Patient requested diagnostic testing 02/21/2020    History reviewed. No pertinent surgical history.  OB History   No obstetric history on file.      Home Medications    Prior to Admission medications   Medication Sig Start Date End Date Taking? Authorizing Provider  amoxicillin-clavulanate (AUGMENTIN) 875-125 MG tablet Take 1 tablet by mouth every 12 (twelve) hours. 03/17/20   Dahlia Byes A, NP  Vitamin D, Ergocalciferol, (DRISDOL) 1.25 MG (50000 UNIT) CAPS capsule Take 1 capsule (50,000 Units total) by mouth once a week for 8 doses. 03/17/20 05/06/20  Janace Aris, NP  cetirizine (ZYRTEC ALLERGY) 10 MG tablet Take 1 tablet (10 mg total) by mouth daily. 02/20/20 03/17/20  Particia Nearing, PA-C  fluticasone (FLONASE) 50 MCG/ACT nasal spray Place 1 spray into both nostrils 2 (two) times daily. 02/20/20 03/17/20  Particia Nearing, PA-C    Family History Family History  Family history unknown: Yes    Social History Social History   Tobacco Use  . Smoking status: Former Smoker    Types:  Cigarettes  . Smokeless tobacco: Never Used  Substance Use Topics  . Alcohol use: No  . Drug use: No     Allergies   Shellfish allergy   Review of Systems Review of Systems   Physical Exam Triage Vital Signs ED Triage Vitals  Enc Vitals Group     BP 03/17/20 0937 128/85     Pulse Rate 03/17/20 0937 (!) 108     Resp 03/17/20 0937 18     Temp 03/17/20 0937 98 F (36.7 C)     Temp Source 03/17/20 0937 Oral     SpO2 03/17/20 0937 100 %     Weight 03/17/20 0942 180 lb (81.6 kg)     Height 03/17/20 0942 5\' 2"  (1.575 m)     Head Circumference --      Peak Flow --      Pain Score 03/17/20 0940 7     Pain Loc --      Pain Edu? --      Excl. in GC? --    No data found.  Updated Vital Signs BP 128/85 (BP Location: Right Arm)   Pulse (!) 108   Temp 98 F (36.7 C) (Oral)   Resp 18   Ht 5\' 2"  (1.575 m)   Wt 180 lb (81.6 kg)   LMP 03/10/2020   SpO2 100%   BMI 32.92 kg/m   Visual Acuity Right Eye Distance:   Left Eye  Distance:   Bilateral Distance:    Right Eye Near:   Left Eye Near:    Bilateral Near:     Physical Exam Vitals and nursing note reviewed.  Constitutional:      General: She is not in acute distress.    Appearance: Normal appearance. She is not ill-appearing, toxic-appearing or diaphoretic.  HENT:     Head: Normocephalic.     Nose: Congestion present.     Mouth/Throat:     Pharynx: Oropharynx is clear.  Eyes:     Conjunctiva/sclera: Conjunctivae normal.  Pulmonary:     Effort: Pulmonary effort is normal.  Musculoskeletal:        General: Normal range of motion.     Cervical back: Normal range of motion.  Skin:    General: Skin is warm and dry.     Findings: No rash.  Neurological:     Mental Status: She is alert.  Psychiatric:        Mood and Affect: Mood normal.      UC Treatments / Results  Labs (all labs ordered are listed, but only abnormal results are displayed) Labs Reviewed  CBC WITH DIFFERENTIAL/PLATELET - Abnormal;  Notable for the following components:      Result Value   RBC 6.03 (*)    MCV 69.3 (*)    MCH 21.6 (*)    RDW 16.2 (*)    All other components within normal limits  COMPREHENSIVE METABOLIC PANEL - Abnormal; Notable for the following components:   Glucose, Bld 118 (*)    Creatinine, Ser 1.17 (*)    All other components within normal limits  VITAMIN D 25 HYDROXY (VIT D DEFICIENCY, FRACTURES) - Abnormal; Notable for the following components:   Vit D, 25-Hydroxy 11.97 (*)    All other components within normal limits  HEMOGLOBIN A1C - Abnormal; Notable for the following components:   Hgb A1c MFr Bld 6.1 (*)    All other components within normal limits  VITAMIN B12    EKG   Radiology No results found.  Procedures Procedures (including critical care time)  Medications Ordered in UC Medications - No data to display  Initial Impression / Assessment and Plan / UC Course  I have reviewed the triage vital signs and the nursing notes.  Pertinent labs & imaging results that were available during my care of the patient were reviewed by me and considered in my medical decision making (see chart for details).     Fatigue and sinus congestion/headache.  Patient was recently treated for sinus infection but lost her antibiotics.  Reporting still having symptoms despite taking the prednisone and allergy medication.  We will go ahead and represcribe the antibiotics for her to finish at this time.  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 Recommended call the post Covid clinic to see if they can help.  Recommended follow-up with primary care provider  Patient is blood work with hemoglobin A1c of 6.1 still in the prediabetic range.  Spoke with patient about dietary changes to lower this. Vitamin D low at 11.9.  We will restart vitamin D weekly x8  weeks. Creatinine slightly elevated.  Believe this may be chronic due to diabetes Patient plans to follow-up primary care in the next 6 to 8 weeks Final Clinical Impressions(s) / UC Diagnoses   Final diagnoses:  Fatigue, unspecified type  Sinus headache     Discharge Instructions     Checking some blood work. I will call you when I get the results.  Vitamin C, Vitamin D and Zince Multivitamin Antibiotics as prescribed.  Flonase and zyrtec daily.   Call the post covid clinic  [phone]    ED Prescriptions    Medication Sig Dispense Auth. Provider   amoxicillin-clavulanate (AUGMENTIN) 875-125 MG tablet Take 1 tablet by mouth every 12 (twelve) hours. 14 tablet Lochlann Mastrangelo A, NP     PDMP not reviewed this encounter.   Dahlia Byes A, NP 03/18/20 1027

## 2020-04-06 ENCOUNTER — Encounter (HOSPITAL_COMMUNITY): Payer: Self-pay | Admitting: Emergency Medicine

## 2020-04-06 ENCOUNTER — Ambulatory Visit (HOSPITAL_COMMUNITY)
Admission: EM | Admit: 2020-04-06 | Discharge: 2020-04-06 | Disposition: A | Discharge status: Home / Self Care | Payer: 59 | Attending: Family Medicine | Admitting: Family Medicine

## 2020-04-06 ENCOUNTER — Other Ambulatory Visit: Payer: Self-pay

## 2020-04-06 ENCOUNTER — Emergency Department (HOSPITAL_COMMUNITY)
Admission: EM | Admit: 2020-04-06 | Discharge: 2020-04-06 | Disposition: A | Discharge status: Home / Self Care | Payer: 59 | Attending: Emergency Medicine | Admitting: Emergency Medicine

## 2020-04-06 DIAGNOSIS — Z7951 Long term (current) use of inhaled steroids: Secondary | ICD-10-CM | POA: Diagnosis not present

## 2020-04-06 DIAGNOSIS — J45909 Unspecified asthma, uncomplicated: Secondary | ICD-10-CM | POA: Insufficient documentation

## 2020-04-06 DIAGNOSIS — Z87891 Personal history of nicotine dependence: Secondary | ICD-10-CM | POA: Insufficient documentation

## 2020-04-06 DIAGNOSIS — R0981 Nasal congestion: Secondary | ICD-10-CM | POA: Insufficient documentation

## 2020-04-06 DIAGNOSIS — J3489 Other specified disorders of nose and nasal sinuses: Secondary | ICD-10-CM | POA: Diagnosis not present

## 2020-04-06 DIAGNOSIS — M542 Cervicalgia: Secondary | ICD-10-CM | POA: Insufficient documentation

## 2020-04-06 MED ORDER — CETIRIZINE HCL 10 MG PO TABS: 10.0000 mg | ORAL_TABLET | Freq: Every day | ORAL | 0 refills | Status: DC

## 2020-04-06 MED ORDER — FLUTICASONE PROPIONATE 50 MCG/ACT NA SUSP: 2.0000 | Freq: Two times a day (BID) | NASAL | 0 refills | Status: DC

## 2020-04-06 NOTE — Discharge Instructions (Signed)
I provided you with the information for a ENT clinic here in [place].  You may follow-up with them.  I also recommend following up your primary care doctor.  Please use Flonase and Zyrtec as prescribed.

## 2020-04-06 NOTE — ED Notes (Signed)
Patient verbalizes understanding of discharge instructions. Opportunity for questioning and answers were provided. Pt discharged from ED. 

## 2020-04-06 NOTE — ED Provider Notes (Signed)
[facility] EMERGENCY DEPARTMENT Provider Note   CSN: [id number] Arrival date & time: 04/06/20  1612     History Chief Complaint  Patient presents with  . Neck Pain    Eileen Clark is a 35 y.o. female.  HPI Patient is 34 year old female with past medical history of asthma presented with approximately 2 months of head pressure, facial pressure, diffuse neck pain.  It seems that this is been an ongoing issue for some time.  Has happened in the spring of last year for her as well.  She takes no medications for allergies.  She was prescribed Flonase at urgent care but has not taken it.  She is also prescribed Augmentin which she took for the entire course with no significant relief.  She states she occasionally has some itchy eyes runny nose and postnasal drainage.  She states she has chronically inflamed tonsils.  She denies any fevers, cough, chest pain or shortness of breath.  No other associated symptoms other than those listed above.  No aggravating mitigating factors.     Past Medical History:  Diagnosis Date  . Asthma     Patient Active Problem List   Diagnosis Date Noted  . Patient requested diagnostic testing 02/21/2020    History reviewed. No pertinent surgical history.   OB History   No obstetric history on file.     Family History  Family history unknown: Yes    Social History   Tobacco Use  . Smoking status: Former Smoker    Types: Cigarettes  . Smokeless tobacco: Never Used  Substance Use Topics  . Alcohol use: No  . Drug use: No    Home Medications Prior to Admission medications   Medication Sig Start Date End Date Taking? Authorizing Provider  amoxicillin-clavulanate (AUGMENTIN) 875-125 MG tablet Take 1 tablet by mouth every 12 (twelve) hours. 03/17/20   Janace Aris, NP  cetirizine (ZYRTEC ALLERGY) 10 MG tablet Take 1 tablet (10 mg total) by mouth daily for 21 days. 04/06/20 04/27/20  Gailen Shelter, PA  fluticasone  (FLONASE) 50 MCG/ACT nasal spray Place 2 sprays into both nostrils in the morning and at bedtime for 14 days. 04/06/20 04/20/20  Gailen Shelter, PA  Vitamin D, Ergocalciferol, (DRISDOL) 1.25 MG (50000 UNIT) CAPS capsule Take 1 capsule (50,000 Units total) by mouth once a week for 8 doses. 03/17/20 05/06/20  Janace Aris, NP    Allergies    Shellfish allergy  Review of Systems   Review of Systems  Constitutional: Positive for fatigue. Negative for fever.  HENT: Positive for congestion, postnasal drip, rhinorrhea, sinus pressure and sinus pain.   Respiratory: Negative for shortness of breath.   Cardiovascular: Negative for chest pain.  Gastrointestinal: Negative for abdominal distention and nausea.  Neurological: Negative for dizziness and headaches.    Physical Exam Updated Vital Signs BP (!) 145/101   Pulse 91   Temp 98.4 F (36.9 C) (Oral)   Resp 16   Ht 5\' 2"  (1.575 m)   Wt 87.1 kg   LMP 03/10/2020   SpO2 100%   BMI 35.12 kg/m   Physical Exam Vitals and nursing note reviewed.  Constitutional:      General: She is not in acute distress. HENT:     Head: Normocephalic and atraumatic.     Right Ear: Tympanic membrane, ear canal and external ear normal.     Left Ear: Tympanic membrane, ear canal and external ear normal.  Nose: Nose normal.     Mouth/Throat:     Mouth: Mucous membranes are moist.     Comments: Somewhat enlarged bilateral tonsils (pt states chronic issue) Eyes:     General: No scleral icterus.    Extraocular Movements: Extraocular movements intact.     Pupils: Pupils are equal, round, and reactive to light.  Neck:     Comments: No tenderness to palpation, no anterior cervical lymphadenopathy. Cardiovascular:     Rate and Rhythm: Normal rate and regular rhythm.     Pulses: Normal pulses.     Heart sounds: Normal heart sounds.  Pulmonary:     Effort: Pulmonary effort is normal. No respiratory distress.     Breath sounds: Normal breath sounds. No  wheezing.  Musculoskeletal:     Cervical back: Normal range of motion.     Right lower leg: No edema.     Left lower leg: No edema.  Skin:    General: Skin is warm and dry.     Capillary Refill: Capillary refill takes less than 2 seconds.  Neurological:     Mental Status: She is alert. Mental status is at baseline.  Psychiatric:        Mood and Affect: Mood normal.        Behavior: Behavior normal.     ED Results / Procedures / Treatments   Labs (all labs ordered are listed, but only abnormal results are displayed) Labs Reviewed - No data to display  EKG None  Radiology No results found.  Procedures Procedures (including critical care time)  Medications Ordered in ED Medications - No data to display  ED Course  I have reviewed the triage vital signs and the nursing notes.  Pertinent labs & imaging results that were available during my care of the patient were reviewed by me and considered in my medical decision making (see chart for details).    MDM Rules/Calculators/A&P                          Patient with chronic head pressure and congestion presented today with same.  She has been on Augmentin in the past with no help.  She has not taken the Flonase that she was prescribed because she states that she was worried about putting anything up her nose.  Physical exam is unremarkable.  She does seem to have symptoms consistent with allergies will treat with Flonase and Zyrtec and discharge and have patient follow-up with her primary care doctor who she is trying establish care with with [facility] physicians.  Patient agreeable to plan.  No further questions at this time.  Mildly elevated blood pressure this will be rechecked and patient will follow up with her primary care doctor for this.  Final Clinical Impression(s) / ED Diagnoses Final diagnoses:  Chronic nasal congestion  Neck pain  Sinus pressure    Rx / DC Orders ED Discharge Orders         Ordered     fluticasone (FLONASE) 50 MCG/ACT nasal spray  2 times daily        04/06/20 1759    cetirizine (ZYRTEC ALLERGY) 10 MG tablet  Daily        04/06/20 1759           Gailen Shelter, [place] 04/06/20 1804    Mancel Bale, MD 04/06/20 2339

## 2020-04-06 NOTE — ED Triage Notes (Signed)
Patient arrives to ED with complaints of ongoing sinus infection for the past two months. Pt states she has neck, facial, and head pressure/pain. Pt has been seen at [facility] x2 with no relief. Was in [place] from 9/7-9/11.

## 2020-04-06 NOTE — ED Notes (Addendum)
Attempted to call patient in lobby x 1, no answer. Patient noted to arrive at [facility] ED at 1612.

## 2023-06-28 ENCOUNTER — Ambulatory Visit (HOSPITAL_COMMUNITY)
Admission: EM | Admit: 2023-06-28 | Discharge: 2023-06-28 | Disposition: A | Discharge status: Home / Self Care | Payer: No Typology Code available for payment source | Attending: Family Medicine | Admitting: Family Medicine

## 2023-06-28 ENCOUNTER — Encounter (HOSPITAL_COMMUNITY): Payer: Self-pay | Admitting: Emergency Medicine

## 2023-06-28 ENCOUNTER — Other Ambulatory Visit: Payer: Self-pay

## 2023-06-28 DIAGNOSIS — J069 Acute upper respiratory infection, unspecified: Secondary | ICD-10-CM | POA: Insufficient documentation

## 2023-06-28 DIAGNOSIS — J029 Acute pharyngitis, unspecified: Secondary | ICD-10-CM | POA: Insufficient documentation

## 2023-06-28 LAB — POCT RAPID STREP A (OFFICE): Rapid Strep A Screen: NEGATIVE

## 2023-06-28 NOTE — Discharge Instructions (Signed)
 You were seen today for upper respiratory symptoms.  Your rapid strep test was negative today.  This will be sent for culture and you will be notified if positive.  In the mean time your symptoms are likely viral.  I recommend over the counter medications like claritin/zyrtec  for sinus congestion, and tylenol , motrin  and salt water gargles for the sore throat.  Please return if your symptoms are not improving or are worsening.

## 2023-06-28 NOTE — ED Triage Notes (Signed)
 Sore throat and sinus drainage for 3 days.    Patient has taken alka seltzer cold and flu.

## 2023-06-28 NOTE — ED Provider Notes (Addendum)
 [facility]    CSN: [id number] Arrival date & time: 06/28/23  1248      History   Chief Complaint Chief Complaint  Patient presents with   Sore Throat   Facial Pain    HPI Eileen Clark is a 38 y.o. female.    Sore Throat  Patient is here for sinus congestion, drainage and sore throat x 3 days. No cough.  At first had some hot flashes, but nothing recent.  She feels the worse in the morning.  She has used alka seltzer cold and flu.  She is worried about strep.        Past Medical History:  Diagnosis Date   Asthma     Patient Active Problem List   Diagnosis Date Noted   Patient requested diagnostic testing 02/21/2020    History reviewed. No pertinent surgical history.  OB History   No obstetric history on file.      Home Medications    Prior to Admission medications   Medication Sig Start Date End Date Taking? Authorizing Provider  amoxicillin -clavulanate (AUGMENTIN ) 875-125 MG tablet Take 1 tablet by mouth every 12 (twelve) hours. Patient not taking: Reported on 06/28/2023 03/17/20   Adah Corning A, NP  cetirizine  (ZYRTEC  ALLERGY) 10 MG tablet Take 1 tablet (10 mg total) by mouth daily for 21 days. Patient not taking: Reported on 06/28/2023 04/06/20 04/27/20  Neldon Hamp RAMAN, PA  fluticasone  (FLONASE ) 50 MCG/ACT nasal spray Place 2 sprays into both nostrils in the morning and at bedtime for 14 days. Patient not taking: Reported on 06/28/2023 04/06/20 04/20/20  Neldon Hamp RAMAN, PA    Family History Family History  Family history unknown: Yes    Social History Social History   Tobacco Use   Smoking status: Every Day    Types: Cigarettes   Smokeless tobacco: Never  Vaping Use   Vaping status: Never Used  Substance Use Topics   Alcohol use: Yes   Drug use: Yes    Types: Marijuana     Allergies   Shellfish allergy   Review of Systems Review of Systems  Constitutional: Negative.   HENT:  Positive for congestion, rhinorrhea  and sore throat.   Respiratory: Negative.    Cardiovascular: Negative.   Gastrointestinal: Negative.   Musculoskeletal: Negative.   Psychiatric/Behavioral: Negative.       Physical Exam Triage Vital Signs ED Triage Vitals  Encounter Vitals Group     BP 06/28/23 1339 (!) 156/107     Systolic BP Percentile --      Diastolic BP Percentile --      Pulse Rate 06/28/23 1339 98     Resp 06/28/23 1339 18     Temp 06/28/23 1339 98 F (36.7 C)     Temp Source 06/28/23 1339 Oral     SpO2 06/28/23 1339 98 %     Weight --      Height --      Head Circumference --      Peak Flow --      Pain Score 06/28/23 1336 5     Pain Loc --      Pain Education --      Exclude from Growth Chart --    No data found.  Updated Vital Signs BP (!) 143/96 (BP Location: Right Arm) Comment (BP Location): repositioning  Pulse 98   Temp 98 F (36.7 C) (Oral)   Resp 18   LMP 06/09/2023 (Approximate)   SpO2 98%  Visual Acuity Right Eye Distance:   Left Eye Distance:   Bilateral Distance:    Right Eye Near:   Left Eye Near:    Bilateral Near:     Physical Exam Constitutional:      General: She is not in acute distress.    Appearance: She is well-developed and normal weight. She is not ill-appearing.  HENT:     Nose: Congestion present. No rhinorrhea.     Mouth/Throat:     Mouth: Mucous membranes are moist.     Pharynx: Pharyngeal swelling and posterior oropharyngeal erythema present.     Tonsils: No tonsillar exudate.  Cardiovascular:     Rate and Rhythm: Normal rate and regular rhythm.     Heart sounds: Normal heart sounds.  Musculoskeletal:     Cervical back: Normal range of motion and neck supple.  Lymphadenopathy:     Cervical: No cervical adenopathy.  Skin:    General: Skin is warm.  Neurological:     General: No focal deficit present.     Mental Status: She is alert.  Psychiatric:        Mood and Affect: Mood normal.      UC Treatments / Results  Labs (all labs ordered  are listed, but only abnormal results are displayed) Labs Reviewed  CULTURE, GROUP A STREP [facility])  POCT RAPID STREP A (OFFICE)    EKG   Radiology No results found.  Procedures Procedures (including critical care time)  Medications Ordered in UC Medications - No data to display  Initial Impression / Assessment and Plan / UC Course  I have reviewed the triage vital signs and the nursing notes.  Pertinent labs & imaging results that were available during my care of the patient were reviewed by me and considered in my medical decision making (see chart for details).   Final Clinical Impressions(s) / UC Diagnoses   Final diagnoses:  Pharyngitis, unspecified etiology  Upper respiratory tract infection, unspecified type     Discharge Instructions      You were seen today for upper respiratory symptoms.  Your rapid strep test was negative today.  This will be sent for culture and you will be notified if positive.  In the mean time your symptoms are likely viral.  I recommend over the counter medications like claritin/zyrtec  for sinus congestion, and tylenol , motrin  and salt water gargles for the sore throat.  Please return if your symptoms are not improving or are worsening.     ED Prescriptions   None    PDMP not reviewed this encounter.   Darral Longs, MD 06/28/23 1410    Darral Longs, MD 06/28/23 1410

## 2023-07-01 LAB — CULTURE, GROUP A STREP (THRC)

## 2024-04-04 ENCOUNTER — Ambulatory Visit (HOSPITAL_COMMUNITY)
Admission: EM | Admit: 2024-04-04 | Discharge: 2024-04-04 | Disposition: A | Discharge status: Home / Self Care | Payer: Self-pay | Attending: Emergency Medicine | Admitting: Emergency Medicine

## 2024-04-04 ENCOUNTER — Encounter (HOSPITAL_COMMUNITY): Payer: Self-pay

## 2024-04-04 DIAGNOSIS — M545 Low back pain, unspecified: Secondary | ICD-10-CM | POA: Insufficient documentation

## 2024-04-04 DIAGNOSIS — Z113 Encounter for screening for infections with a predominantly sexual mode of transmission: Secondary | ICD-10-CM | POA: Insufficient documentation

## 2024-04-04 DIAGNOSIS — K5900 Constipation, unspecified: Secondary | ICD-10-CM | POA: Insufficient documentation

## 2024-04-04 DIAGNOSIS — Z3202 Encounter for pregnancy test, result negative: Secondary | ICD-10-CM

## 2024-04-04 LAB — POCT URINALYSIS DIP (MANUAL ENTRY)
Bilirubin, UA: NEGATIVE
Glucose, UA: NEGATIVE mg/dL
Ketones, POC UA: NEGATIVE mg/dL
Leukocytes, UA: NEGATIVE
Nitrite, UA: NEGATIVE
Protein Ur, POC: NEGATIVE mg/dL
Spec Grav, UA: 1.01 (ref 1.010–1.025)
Urobilinogen, UA: 0.2 U/dL
pH, UA: 6 (ref 5.0–8.0)

## 2024-04-04 LAB — POCT URINE PREGNANCY: Preg Test, Ur: NEGATIVE

## 2024-04-04 LAB — HIV ANTIBODY (ROUTINE TESTING W REFLEX): HIV Screen 4th Generation wRfx: NONREACTIVE

## 2024-04-04 NOTE — ED Triage Notes (Signed)
 Pt c/o lower back pain and constipation for past few weeks. States last time she had this is was a STD. Requesting STD testing. Denies injury. Took tylenol  and ibuprofen  with relief.

## 2024-04-04 NOTE — Discharge Instructions (Addendum)
 The results of your vaginal swab test which screens for BV, yeast, gonorrhea, chlamydia and trichomonas will be posted to your MyChart account once it is complete.  This typically takes 2 to 4 days.  Please abstain from sexual intercourse of any kind, vaginal, oral or anal, until you have received the results of your STD testing.        The results of your HIV and syphilis blood tests will be made available to you once they are complete.  They will initially be posted to your MyChart account which typically takes 2 to 3 days.        If any of your results are abnormal, you will receive a phone call regarding treatment.  Prescriptions, if any are needed, will be provided for you at your pharmacy.        Your urine pregnancy test today is negative.       Our point-of-care analysis of your urine sample today was normal and did not reveal any concern for urinary tract infection.       For constipation, please begin: MiraLAX (polyethylene glycol): This is a very effective stool softener that works by pulling water into your stool increasing the volume of your stool and making the consistency of your stool very soft.  MiraLAX is not absorbed into the body at all, it remains entirely in your gastrointestinal track, it will not affect your blood sugar or electrolyte levels and it will not interfere with any of your current medications.  For prevention of constipation, I recommend that you take 1 capful (17 g) of the MiraLAX powder mixed into 8 ounces of water every morning whether you are feeling constipated or not.  I recommend that you take MiraLAX daily for 3 months to retrain your bowels.  During the first few days of taking MiraLAX, you may notice that you have more gas than usual.  This is normal and is the result of movement of gas that was previously trapped by retained stool.  If you have not had complete resolution of your symptoms after completing any recommended treatment or if your symptoms  worsen, please return for repeat evaluation.       Thank you for visiting [facility] today.  We appreciate the opportunity to participate in your care.

## 2024-04-04 NOTE — ED Provider Notes (Addendum)
 [facility]    CSN: [id number] Arrival date & time: 04/04/24  9162    HISTORY   Chief Complaint  Patient presents with   Back Pain   SEXUALLY TRANSMITTED DISEASE   HPI Eileen Clark is a pleasant, 38 y.o. female who presents to urgent care today. Pt c/o lower back pain and constipation for past few weeks. States last time she had these symptoms, she had chlamydia.  Patient is requesting STD testing today.  Endorses possible STD exposure, known STD.  Patient states she has taken tylenol  and ibuprofen  with relief.  Has not tried anything to relieve her constipation.  Denies fever, body aches, chills, vaginal discharge, burning with urination, and increased frequency of urination.  The history is provided by the patient.  Back Pain   Past Medical History:  Diagnosis Date   Asthma    Patient Active Problem List   Diagnosis Date Noted   Patient requested diagnostic testing 02/21/2020   History reviewed. No pertinent surgical history. OB History   No obstetric history on file.    Home Medications    Prior to Admission medications   Not on File    Family History Family History  Family history unknown: Yes   Social History Social History   Tobacco Use   Smoking status: Former    Types: Cigarettes   Smokeless tobacco: Never  Vaping Use   Vaping status: Never Used  Substance Use Topics   Alcohol use: Yes   Drug use: Yes    Types: Marijuana   Allergies   Shellfish allergy  Review of Systems Review of Systems  Musculoskeletal:  Positive for back pain.   Pertinent findings revealed after performing a 14 point review of systems has been noted in the history of present illness.  Physical Exam Vital Signs BP (!) 144/92 (BP Location: Right Arm)   Pulse 92   Temp 98.3 F (36.8 C) (Oral)   Resp 18   LMP 03/20/2024 (Exact Date)   SpO2 100%   No data found.  Physical Exam Vitals and nursing note reviewed.  Constitutional:      General:  She is awake. She is not in acute distress.    Appearance: Normal appearance. She is well-developed and well-groomed. She is not ill-appearing.  HENT:     Head: Normocephalic and atraumatic.  Neck:     Trachea: Trachea and phonation normal.  Cardiovascular:     Rate and Rhythm: Normal rate and regular rhythm.  Pulmonary:     Effort: Pulmonary effort is normal.  Abdominal:     General: Abdomen is flat. Bowel sounds are decreased.     Palpations: Abdomen is soft.     Tenderness: There is generalized abdominal tenderness.  Musculoskeletal:        General: Normal range of motion.     Cervical back: Full passive range of motion without pain, normal range of motion and neck supple.  Lymphadenopathy:     Cervical: No cervical adenopathy.  Skin:    General: Skin is warm and dry.     Findings: No erythema or rash.  Neurological:     General: No focal deficit present.     Mental Status: She is alert and oriented to person, place, and time. Mental status is at baseline.  Psychiatric:        Attention and Perception: Attention and perception normal.        Mood and Affect: Mood normal.  Speech: Speech normal.        Behavior: Behavior normal. Behavior is cooperative.        Thought Content: Thought content normal.     Visual Acuity Right Eye Distance:   Left Eye Distance:   Bilateral Distance:    Right Eye Near:   Left Eye Near:    Bilateral Near:     UC Couse / Diagnostics / Procedures:     Radiology No results found.  Procedures Procedures (including critical care time) EKG  Pending results:  Labs Reviewed  POCT URINALYSIS DIP (MANUAL ENTRY) - Abnormal; Notable for the following components:      Result Value   Blood, UA trace-intact (*)    All other components within normal limits  HIV ANTIBODY (ROUTINE TESTING W REFLEX)  RPR  POCT URINE PREGNANCY  CERVICOVAGINAL ANCILLARY ONLY    Medications Ordered in UC: Medications - No data to display  UC Diagnoses  / Final Clinical Impressions(s)   I have reviewed the triage vital signs and the nursing notes.  Pertinent labs & imaging results that were available during my care of the patient were reviewed by me and considered in my medical decision making (see chart for details).    Final diagnoses:  Screening examination for STD (sexually transmitted disease)  Acute bilateral low back pain without sciatica  Constipation, unspecified constipation type   STD screening was performed, patient advised that the results be posted to their MyChart and if any of the results are positive, they will be notified by phone, further treatment will be provided as indicated based on results of STD screening. Patient was advised to abstain from sexual intercourse until that they receive the results of their STD testing.  Patient was also advised to use condoms to protect themselves from STD exposure. Urinalysis today was normal. Urine pregnancy test was negative. Daily MiraLAX recommended for relief of constipation. Return precautions advised.  Drug allergies reviewed, all questions addressed.   Please see discharge instructions below for details of plan of care as provided to patient. ED Prescriptions   None    PDMP not reviewed this encounter.  Disposition Upon Discharge:  Condition: stable for discharge home  Patient presented with concern for an acute illness with associated systemic symptoms and significant discomfort requiring urgent management. In my opinion, this is a condition that a prudent lay person (someone who possesses an average knowledge of health and medicine) may potentially expect to result in complications if not addressed urgently such as respiratory distress, impairment of bodily function or dysfunction of bodily organs.   As such, the patient has been evaluated and assessed, work-up was performed and treatment was provided in alignment with urgent care protocols and evidence based medicine.   Patient/parent/caregiver has been advised that the patient may require follow up for further testing and/or treatment if the symptoms continue in spite of treatment, as clinically indicated and appropriate.  Routine symptom specific, illness specific and/or disease specific instructions were discussed with the patient and/or caregiver at length.  Prevention strategies for avoiding STD exposure were also discussed.  The patient will follow up with their current PCP if and as advised. If the patient does not currently have a PCP we will assist them in obtaining one.   The patient may need specialty follow up if the symptoms continue, in spite of conservative treatment and management, for further workup, evaluation, consultation and treatment as clinically indicated and appropriate.  Patient/parent/caregiver verbalized understanding and agreement of plan  as discussed.  All questions were addressed during visit.  Please see discharge instructions below for further details of plan.    Discharge Instructions      The results of your vaginal swab test which screens for BV, yeast, gonorrhea, chlamydia and trichomonas will be posted to your MyChart account once it is complete.  This typically takes 2 to 4 days.  Please abstain from sexual intercourse of any kind, vaginal, oral or anal, until you have received the results of your STD testing.        The results of your HIV and syphilis blood tests will be made available to you once they are complete.  They will initially be posted to your MyChart account which typically takes 2 to 3 days.        If any of your results are abnormal, you will receive a phone call regarding treatment.  Prescriptions, if any are needed, will be provided for you at your pharmacy.        Your urine pregnancy test today is negative.       Our point-of-care analysis of your urine sample today was normal and did not reveal any concern for urinary tract infection.       For  constipation, please begin: MiraLAX (polyethylene glycol): This is a very effective stool softener that works by pulling water into your stool increasing the volume of your stool and making the consistency of your stool very soft.  MiraLAX is not absorbed into the body at all, it remains entirely in your gastrointestinal track, it will not affect your blood sugar or electrolyte levels and it will not interfere with any of your current medications.  For prevention of constipation, I recommend that you take 1 capful (17 g) of the MiraLAX powder mixed into 8 ounces of water every morning whether you are feeling constipated or not.  I recommend that you take MiraLAX daily for 3 months to retrain your bowels.  During the first few days of taking MiraLAX, you may notice that you have more gas than usual.  This is normal and is the result of movement of gas that was previously trapped by retained stool.  If you have not had complete resolution of your symptoms after completing any recommended treatment or if your symptoms worsen, please return for repeat evaluation.       Thank you for visiting [facility] today.  We appreciate the opportunity to participate in your care.     This office note has been dictated using Teaching laboratory technician.  Unfortunately, this method of dictation can sometimes lead to typographical or grammatical errors.  I apologize for your inconvenience in advance if this occurs.  Please do not hesitate to reach out to me if clarification is needed.       Joesph Shaver Scales, PA-C 04/04/24 1004    Joesph Shaver Petty, [place] 04/04/24 1005

## 2024-04-05 LAB — RPR: RPR Ser Ql: NONREACTIVE

## 2024-04-07 ENCOUNTER — Ambulatory Visit: Payer: Self-pay | Admitting: Emergency Medicine

## 2024-04-07 LAB — CERVICOVAGINAL ANCILLARY ONLY
Bacterial Vaginitis (gardnerella): POSITIVE — AB
Candida Glabrata: NEGATIVE
Candida Vaginitis: NEGATIVE
Chlamydia: NEGATIVE
Comment: NEGATIVE
Comment: NEGATIVE
Comment: NEGATIVE
Comment: NEGATIVE
Comment: NEGATIVE
Comment: NORMAL
Neisseria Gonorrhea: NEGATIVE
Trichomonas: NEGATIVE
